# Patient Record
Sex: Female | Born: 1975
Health system: Southern US, Community
[De-identification: ages and names within clinical notes are randomized; demographics above are authoritative.]

## PROBLEM LIST (undated history)

## (undated) DIAGNOSIS — C44621 Squamous cell carcinoma of skin of unspecified upper limb, including shoulder: Secondary | ICD-10-CM

## (undated) DIAGNOSIS — E785 Hyperlipidemia, unspecified: Secondary | ICD-10-CM

## (undated) DIAGNOSIS — Z87442 Personal history of urinary calculi: Secondary | ICD-10-CM

## (undated) HISTORY — DX: Hyperlipidemia, unspecified: E78.5

## (undated) HISTORY — PX: KIDNEY STONE SURGERY: SHX686

## (undated) HISTORY — PX: SPHINCTEROTOMY: SHX5279

## (undated) HISTORY — DX: Squamous cell carcinoma of skin of unspecified upper limb, including shoulder: C44.621

## (undated) HISTORY — DX: Personal history of urinary calculi: Z87.442

---

## 1998-04-16 ENCOUNTER — Other Ambulatory Visit: Admission: RE | Admit: 1998-04-16 | Discharge: 1998-04-16 | Payer: Self-pay | Admitting: Obstetrics and Gynecology

## 1999-04-15 ENCOUNTER — Other Ambulatory Visit: Admission: RE | Admit: 1999-04-15 | Discharge: 1999-04-15 | Payer: Self-pay | Admitting: *Deleted

## 2000-06-09 ENCOUNTER — Other Ambulatory Visit: Admission: RE | Admit: 2000-06-09 | Discharge: 2000-06-09 | Payer: Self-pay | Admitting: *Deleted

## 2001-03-08 ENCOUNTER — Other Ambulatory Visit: Admission: RE | Admit: 2001-03-08 | Discharge: 2001-03-08 | Payer: Self-pay | Admitting: *Deleted

## 2001-09-29 ENCOUNTER — Inpatient Hospital Stay (HOSPITAL_COMMUNITY): Admission: AD | Admit: 2001-09-29 | Discharge: 2001-10-01 | Payer: Self-pay | Admitting: *Deleted

## 2001-11-03 ENCOUNTER — Other Ambulatory Visit: Admission: RE | Admit: 2001-11-03 | Discharge: 2001-11-03 | Payer: Self-pay | Admitting: *Deleted

## 2002-01-31 ENCOUNTER — Encounter: Admission: RE | Admit: 2002-01-31 | Discharge: 2002-01-31 | Payer: Self-pay | Admitting: Family Medicine

## 2002-01-31 ENCOUNTER — Encounter: Payer: Self-pay | Admitting: Family Medicine

## 2002-11-09 ENCOUNTER — Other Ambulatory Visit: Admission: RE | Admit: 2002-11-09 | Discharge: 2002-11-09 | Payer: Self-pay | Admitting: *Deleted

## 2005-12-30 ENCOUNTER — Encounter: Admission: RE | Admit: 2005-12-30 | Discharge: 2005-12-30 | Payer: Self-pay | Admitting: *Deleted

## 2008-01-12 IMAGING — MG MM DIGITAL DIAGNOSTIC BILAT
5 series · 5 of 5 positions shown · non-contrast
Comparison: none

DG DIAGNOSTIC BILATERAL
Bilateral CC and MLO view(s) were taken.

RIGHT BREAST ULTRASOUND
DIGITAL BILATERAL DIAGNOSTIC MAMMOGRAM WITH CAD AND RIGHT BREAST ULTRASOUND:
CLINICAL DATA: Right breast lump in the upper outer quadrant.

[R CC]
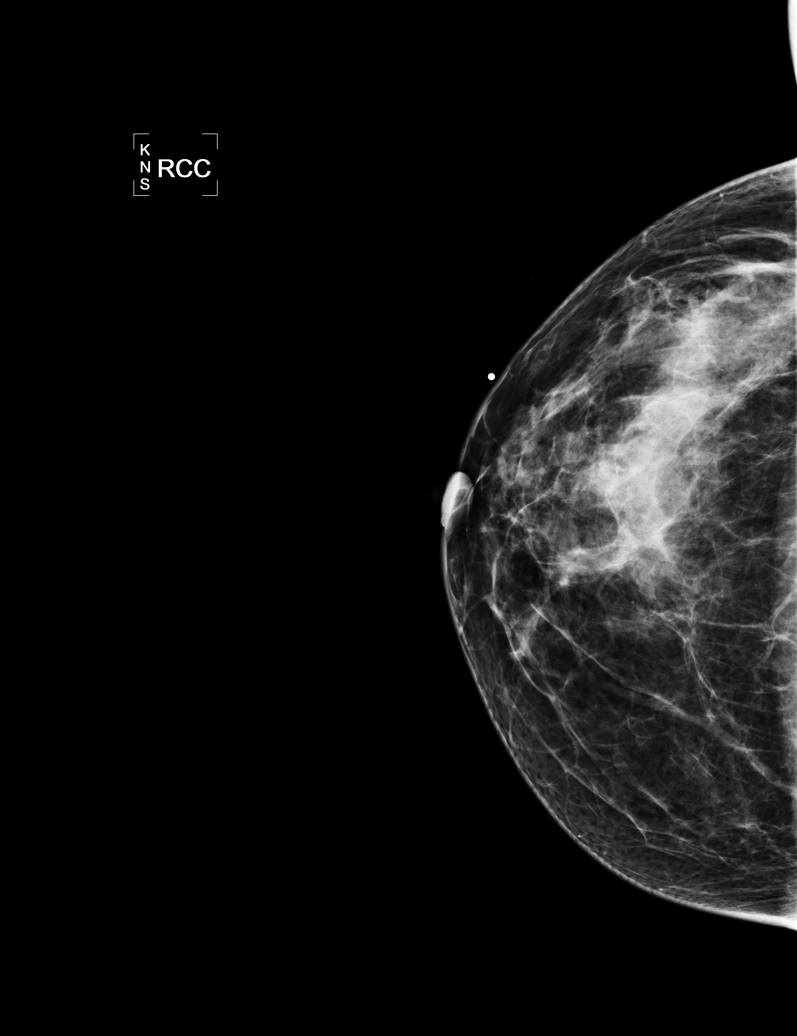

[L CC]
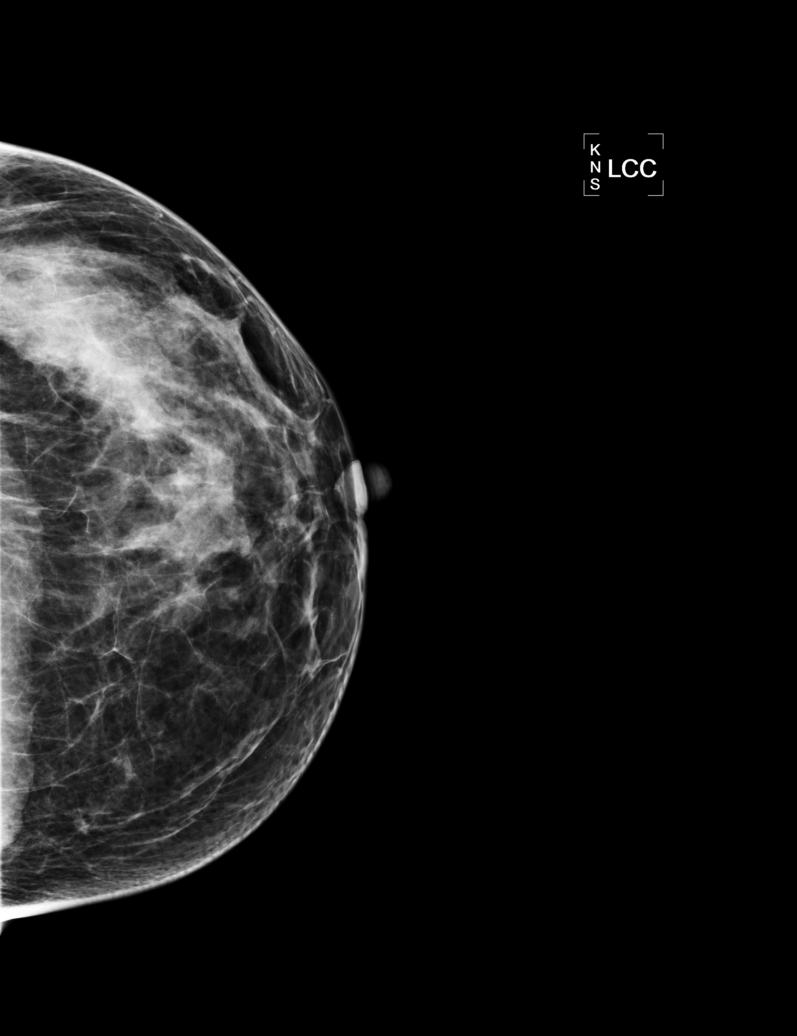

[L MLO]
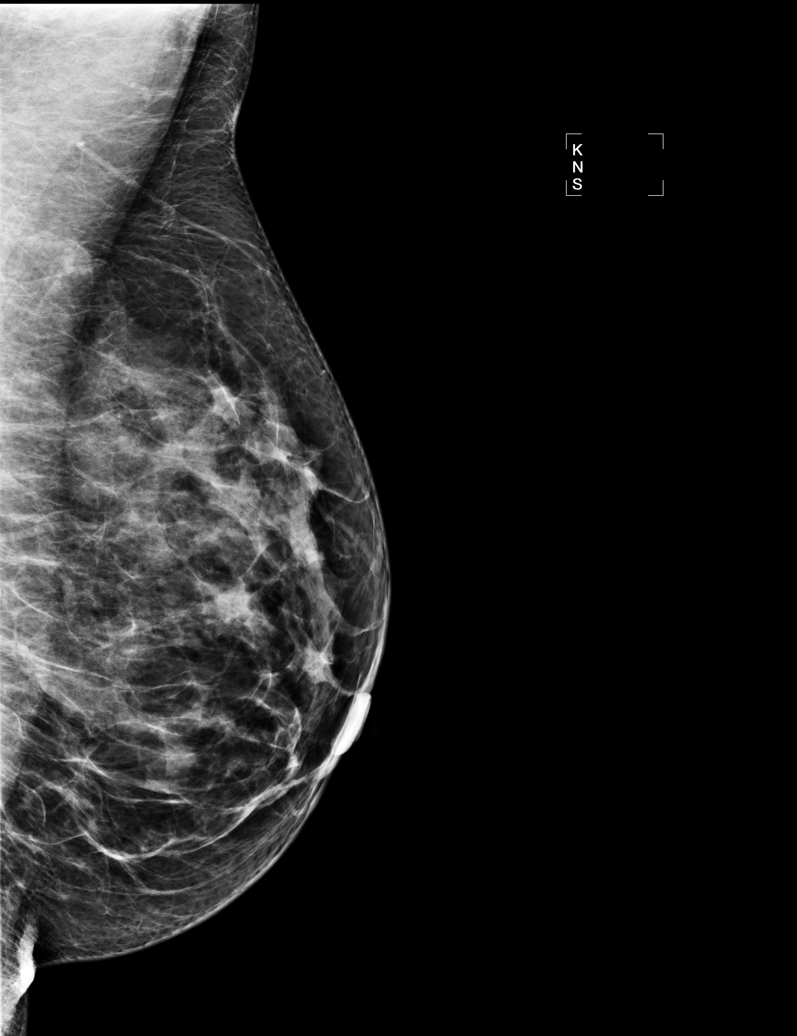

[R MLO]
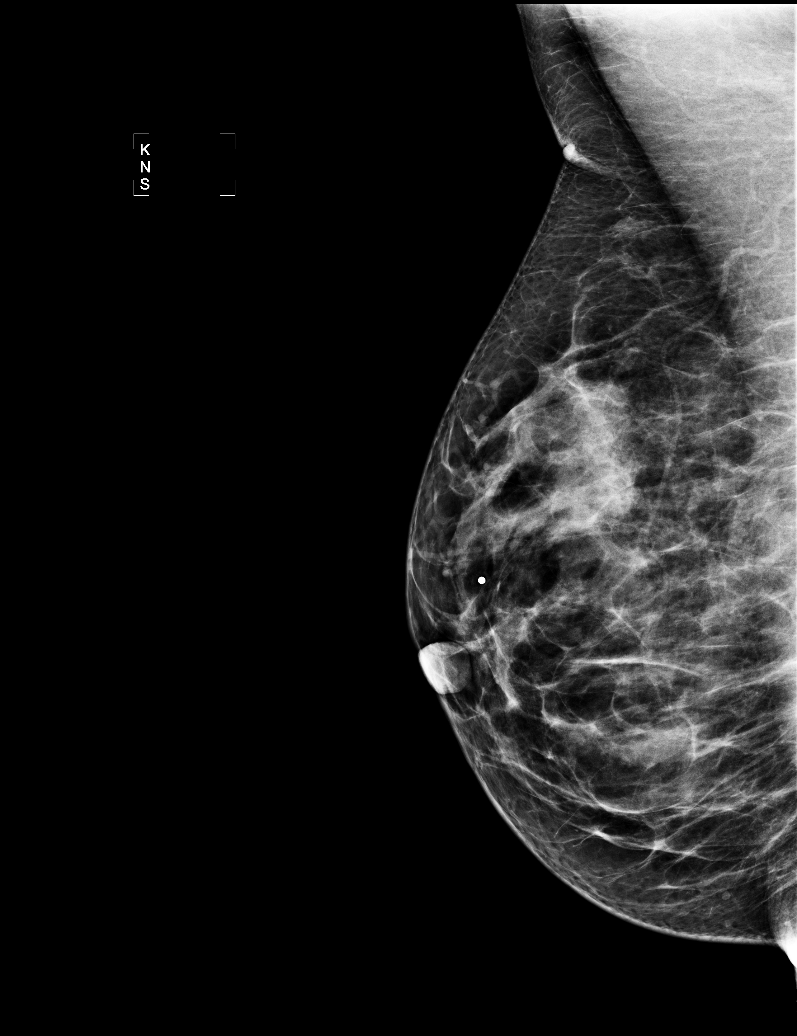

[R TAN]
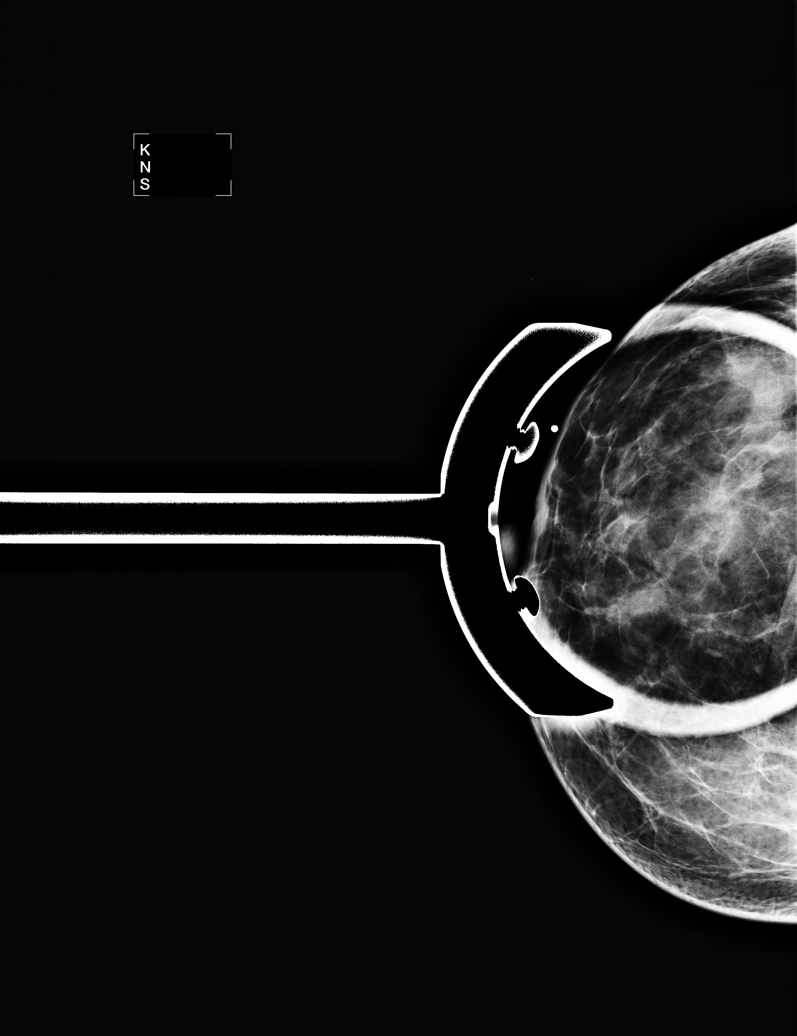

[5 of 5 positions shown; findings below may reference images not displayed]

This is the patient's baseline mammogram.  The fibroglandular parenchymal pattern is symmetric and 
stable.  No dominant masses, suspicious calcifications, or areas of architectural distortion are 
seen bilaterally.

On physical exam today, I palpate soft lumpy tissue throughout the upper outer quadrant in the 
patient's region of palpable concern and tenderness.  Ultrasound of this region reveals normal, but
dense fibroglandular tissue only.
IMPRESSION: There is no specific radiographic evidence of malignancy bilaterally.  Screening mammogram at age 
38 is recommended.

ASSESSMENT: Negative - BI-RADS 1

Routine screening mammogram at age 38.
ANALYZED BY COMPUTER AIDED DETECTION. ,

## 2009-02-25 LAB — HM MAMMOGRAPHY: HM MAMMO: NORMAL

## 2010-06-19 ENCOUNTER — Ambulatory Visit: Payer: Self-pay | Admitting: Surgery

## 2010-06-25 ENCOUNTER — Ambulatory Visit: Payer: Self-pay | Admitting: Surgery

## 2010-06-26 LAB — PATHOLOGY REPORT

## 2012-02-26 LAB — HM PAP SMEAR: HM Pap smear: NORMAL

## 2014-02-25 ENCOUNTER — Encounter: Payer: Self-pay | Admitting: Internal Medicine

## 2014-02-25 ENCOUNTER — Ambulatory Visit: Payer: Self-pay | Admitting: Internal Medicine

## 2014-02-25 ENCOUNTER — Encounter (INDEPENDENT_AMBULATORY_CARE_PROVIDER_SITE_OTHER): Payer: Self-pay

## 2014-02-25 ENCOUNTER — Ambulatory Visit (INDEPENDENT_AMBULATORY_CARE_PROVIDER_SITE_OTHER): Payer: BC Managed Care – PPO | Admitting: Internal Medicine

## 2014-02-25 VITALS — BP 100/66 | HR 61 | Temp 97.7°F | Ht 67.5 in | Wt 191.2 lb

## 2014-02-25 DIAGNOSIS — F5105 Insomnia due to other mental disorder: Secondary | ICD-10-CM | POA: Insufficient documentation

## 2014-02-25 DIAGNOSIS — F411 Generalized anxiety disorder: Secondary | ICD-10-CM

## 2014-02-25 DIAGNOSIS — E663 Overweight: Secondary | ICD-10-CM | POA: Insufficient documentation

## 2014-02-25 DIAGNOSIS — E669 Obesity, unspecified: Secondary | ICD-10-CM

## 2014-02-25 DIAGNOSIS — Z1239 Encounter for other screening for malignant neoplasm of breast: Secondary | ICD-10-CM | POA: Insufficient documentation

## 2014-02-25 DIAGNOSIS — Z800 Family history of malignant neoplasm of digestive organs: Secondary | ICD-10-CM | POA: Insufficient documentation

## 2014-02-25 DIAGNOSIS — Z23 Encounter for immunization: Secondary | ICD-10-CM

## 2014-02-25 DIAGNOSIS — F419 Anxiety disorder, unspecified: Secondary | ICD-10-CM | POA: Insufficient documentation

## 2014-02-25 MED ORDER — ALPRAZOLAM 0.25 MG PO TABS: 0.2500 mg | ORAL_TABLET | Freq: Two times a day (BID) | ORAL | Status: DC | PRN

## 2014-02-25 MED ORDER — PHENTERMINE HCL 37.5 MG PO CAPS: 37.5000 mg | ORAL_CAPSULE | ORAL | Status: DC

## 2014-02-25 NOTE — Assessment & Plan Note (Signed)
Wt Readings from Last 3 Encounters:  02/25/14 191 lb 4 oz (86.75 kg)   Body mass index is 29.49 kg/(m^2). Encouraged healthy diet and exercise. Will add phentermine to help with appetite suppression. Discussed potential side effects from this medication. Follow up in 1 month and prn.

## 2014-02-25 NOTE — Progress Notes (Signed)
Pre visit review using our clinic review tool, if applicable. No additional management support is needed unless otherwise documented below in the visit note. 

## 2014-02-25 NOTE — Assessment & Plan Note (Signed)
Mammogram ordered

## 2014-02-25 NOTE — Progress Notes (Signed)
Subjective:    Patient ID: Alexis Little, female    DOB: May 23, 1976, 38 y.o.   MRN: [id number]  HPI 38YO female presents to establish care.  Last year, had some depression after her father passed away last year. Started seeing Dr. Mervyn Skeeters, a psychologist, with some improvement. Continues to have occasional panic attacks, rare. Occasionally uses Alprazolam 0.25mg .  Concerned about weight. Has started walking 33min to 1 hr per day in effort to lose weight. Following healthy diet. Has lost 6lb on her own. Would like to use medication to help with appetite. Has never taken medication for this in the past.  Review of Systems  Constitutional: Negative for fever, chills, appetite change, fatigue and unexpected weight change.  Eyes: Negative for visual disturbance.  Respiratory: Negative for shortness of breath.   Cardiovascular: Negative for chest pain and leg swelling.  Gastrointestinal: Negative for nausea, vomiting, abdominal pain, diarrhea and constipation.  Musculoskeletal: Negative for arthralgias and myalgias.  Skin: Negative for color change and rash.  Hematological: Negative for adenopathy. Does not bruise/bleed easily.  Psychiatric/Behavioral: Negative for suicidal ideas, sleep disturbance and dysphoric mood. The patient is nervous/anxious.        Objective:    BP 100/66  Pulse 61  Temp(Src) 97.7 F (36.5 C) (Oral)  Ht 5' 7.5" (1.715 m)  Wt 191 lb 4 oz (86.75 kg)  BMI 29.49 kg/m2  SpO2 99%  LMP 01/28/2014 Physical Exam  Constitutional: She is oriented to person, place, and time. She appears well-developed and well-nourished. No distress.  HENT:  Head: Normocephalic and atraumatic.  Right Ear: External ear normal.  Left Ear: External ear normal.  Nose: Nose normal.  Mouth/Throat: Oropharynx is clear and moist. No oropharyngeal exudate.  Eyes: Conjunctivae are normal. Pupils are equal, round, and reactive to light. Right eye exhibits no discharge. Left eye exhibits no  discharge. No scleral icterus.  Neck: Normal range of motion. Neck supple. No tracheal deviation present. No thyromegaly present.  Cardiovascular: Normal rate, regular rhythm, normal heart sounds and intact distal pulses.  Exam reveals no gallop and no friction rub.   No murmur heard. Pulmonary/Chest: Effort normal and breath sounds normal. No accessory muscle usage. Not tachypneic. No respiratory distress. She has no decreased breath sounds. She has no wheezes. She has no rhonchi. She has no rales. She exhibits no tenderness.  Musculoskeletal: Normal range of motion. She exhibits no edema and no tenderness.  Lymphadenopathy:    She has no cervical adenopathy.  Neurological: She is alert and oriented to person, place, and time. No cranial nerve deficit. She exhibits normal muscle tone. Coordination normal.  Skin: Skin is warm and dry. No rash noted. She is not diaphoretic. No erythema. No pallor.  Psychiatric: She has a normal mood and affect. Her behavior is normal. Judgment and thought content normal.          Assessment & Plan:   Problem List Items Addressed This Visit     Unprioritized   Anxiety state     Continue to follow with Dr. Mervyn Skeeters. Continue prn Alprazolam.    Relevant Medications      ALPRAZolam  Duanne Moron) tablet   Family history of colon cancer     Strong family h/o colon cancer. Will set up evaluation with Dr. Tiffany Kocher for possible repeat colonoscopy. (last over 20 years ago)    Relevant Orders      Ambulatory referral to Gastroenterology   Overweight (BMI 25.0-29.9) - Primary  Wt Readings from Last 3 Encounters:  02/25/14 191 lb 4 oz (86.75 kg)   Body mass index is 29.49 kg/(m^2). Encouraged healthy diet and exercise. Will add phentermine to help with appetite suppression. Discussed potential side effects from this medication. Follow up in 1 month and prn.    Relevant Medications      phentermine capsule   Screening for breast cancer     Mammogram ordered.      Relevant Orders      MM Digital Screening       Return in about 4 weeks (around 03/25/2014) for Recheck.

## 2014-02-25 NOTE — Assessment & Plan Note (Signed)
Strong family h/o colon cancer. Will set up evaluation with Dr. Tiffany Kocher for possible repeat colonoscopy. (last over 20 years ago)

## 2014-02-25 NOTE — Patient Instructions (Signed)
Start Phentermine 37.5mg daily in the morning.  Follow up in 4 weeks. 

## 2014-02-25 NOTE — Assessment & Plan Note (Signed)
Continue to follow with Dr. Mervyn Skeeters. Continue prn Alprazolam.

## 2014-03-25 ENCOUNTER — Ambulatory Visit: Payer: Self-pay | Admitting: Internal Medicine

## 2014-03-25 ENCOUNTER — Ambulatory Visit (INDEPENDENT_AMBULATORY_CARE_PROVIDER_SITE_OTHER): Payer: BC Managed Care – PPO | Admitting: Internal Medicine

## 2014-03-25 ENCOUNTER — Encounter: Payer: Self-pay | Admitting: Internal Medicine

## 2014-03-25 VITALS — BP 110/76 | HR 71 | Temp 97.6°F | Ht 67.5 in | Wt 184.8 lb

## 2014-03-25 DIAGNOSIS — E663 Overweight: Secondary | ICD-10-CM

## 2014-03-25 DIAGNOSIS — L739 Follicular disorder, unspecified: Secondary | ICD-10-CM

## 2014-03-25 DIAGNOSIS — L731 Pseudofolliculitis barbae: Secondary | ICD-10-CM

## 2014-03-25 MED ORDER — GENTAMICIN SULFATE 0.1 % EX OINT: 1.0000 "application " | TOPICAL_OINTMENT | Freq: Three times a day (TID) | CUTANEOUS | Status: DC

## 2014-03-25 MED ORDER — PHENTERMINE HCL 37.5 MG PO CAPS: 37.5000 mg | ORAL_CAPSULE | ORAL | Status: DC

## 2014-03-25 NOTE — Assessment & Plan Note (Signed)
Wt Readings from Last 3 Encounters:  03/25/14 184 lb 12 oz (83.802 kg)  02/25/14 191 lb 4 oz (86.75 kg)   Body mass index is 28.49 kg/(m^2). Congratulated pt on weight loss. Will continue Phentermine. Encouraged healthy diet and exercise.

## 2014-03-25 NOTE — Patient Instructions (Signed)
Start Gentamicin twice daily to groin area. Call if lesions are not improving.  Follow up in 3 months or sooner as needed.

## 2014-03-25 NOTE — Assessment & Plan Note (Signed)
Start topical gentamicin. If no improvement, will plan to start oral doxycycline. Pt will email with update.

## 2014-03-25 NOTE — Progress Notes (Signed)
Subjective:    Patient ID: Alexis Little, female    DOB: 1975-12-29, 38 y.o.   MRN: [id number]  HPI 38YO female presents for follow up.  Overweight - Started on Phentermine to help with appetite suppression at visit 10/5. Tolerating well. Down 12lbs overall at home.  Walking daily and following healthy diet.  History of staph cellulitis over groin area in past.  Now notes some infected hair follicles in groin around time of menses. No fever, chills. Not taking anything for this. Has applied Peroxide.  Review of Systems  Constitutional: Negative for fever, chills, appetite change, fatigue and unexpected weight change.  Eyes: Negative for visual disturbance.  Respiratory: Negative for shortness of breath.   Cardiovascular: Negative for chest pain and leg swelling.  Gastrointestinal: Negative for abdominal pain.  Skin: Positive for color change, rash and wound.  Hematological: Negative for adenopathy. Does not bruise/bleed easily.  Psychiatric/Behavioral: Negative for dysphoric mood. The patient is not nervous/anxious.        Objective:    BP 110/76 mmHg  Pulse 71  Temp(Src) 97.6 F (36.4 C) (Oral)  Ht 5' 7.5" (1.715 m)  Wt 184 lb 12 oz (83.802 kg)  BMI 28.49 kg/m2  SpO2 95%  LMP 03/24/2014 Physical Exam  Constitutional: She is oriented to person, place, and time. She appears well-developed and well-nourished. No distress.  HENT:  Head: Normocephalic and atraumatic.  Right Ear: External ear normal.  Left Ear: External ear normal.  Nose: Nose normal.  Mouth/Throat: Oropharynx is clear and moist.  Eyes: Conjunctivae are normal. Pupils are equal, round, and reactive to light. Right eye exhibits no discharge. Left eye exhibits no discharge. No scleral icterus.  Neck: Normal range of motion. Neck supple. No tracheal deviation present. No thyromegaly present.  Cardiovascular: Normal rate, regular rhythm, normal heart sounds and intact distal pulses.  Exam reveals no gallop and  no friction rub.   No murmur heard. Pulmonary/Chest: Effort normal and breath sounds normal. No accessory muscle usage. No tachypnea. No respiratory distress. She has no decreased breath sounds. She has no wheezes. She has no rhonchi. She has no rales. She exhibits no tenderness.  Musculoskeletal: Normal range of motion. She exhibits no edema or tenderness.  Lymphadenopathy:    She has no cervical adenopathy.  Neurological: She is alert and oriented to person, place, and time. No cranial nerve deficit. She exhibits normal muscle tone. Coordination normal.  Skin: Skin is warm and dry. Rash (pustular over medial upper thighs bilaterally) noted. She is not diaphoretic. There is erythema. No pallor.  Psychiatric: She has a normal mood and affect. Her behavior is normal. Judgment and thought content normal.          Assessment & Plan:   Problem List Items Addressed This Visit      Unprioritized   Folliculitis of perineum    Start topical gentamicin. If no improvement, will plan to start oral doxycycline. Pt will email with update.    Relevant Medications      gentamicin (GARAMYCIN) ointment 0.1%   Overweight (BMI 25.0-29.9) - Primary    Wt Readings from Last 3 Encounters:  03/25/14 184 lb 12 oz (83.802 kg)  02/25/14 191 lb 4 oz (86.75 kg)   Body mass index is 28.49 kg/(m^2). Congratulated pt on weight loss. Will continue Phentermine. Encouraged healthy diet and exercise.    Relevant Medications      phentermine capsule       Return in about 3 months (around  06/25/2014).   

## 2014-03-27 ENCOUNTER — Telehealth: Payer: Self-pay | Admitting: Internal Medicine

## 2014-03-27 NOTE — Telephone Encounter (Signed)
Pt scheduled for additional views on 04/08/14.

## 2014-03-27 NOTE — Telephone Encounter (Signed)
Mammogram from 11/2 recommended additional views for asymmetry in the left breast. Has this been scheduled?

## 2014-04-08 ENCOUNTER — Ambulatory Visit: Payer: Self-pay | Admitting: Internal Medicine

## 2014-04-09 ENCOUNTER — Encounter: Payer: Self-pay | Admitting: *Deleted

## 2014-04-09 ENCOUNTER — Telehealth: Payer: Self-pay | Admitting: Internal Medicine

## 2014-04-09 DIAGNOSIS — R928 Other abnormal and inconclusive findings on diagnostic imaging of breast: Secondary | ICD-10-CM

## 2014-04-09 NOTE — Telephone Encounter (Signed)
Notified pt,  Pt is agreeable with seeing Dr Bary Castilla

## 2014-04-09 NOTE — Telephone Encounter (Signed)
[place] left breast and mammogram left breast show what appears to be a cluster of cysts in the breast. They have recommended follow up mammogram in 6 months. I would recommend having one of the surgeons, such as Dr. Bary Castilla, look at these images and decide if best course to wait or to do a needle aspiration now. We can set this up for her if she would like.

## 2014-04-09 NOTE — Telephone Encounter (Signed)
Bonnita Nasuti, Can you please schedule this? Thanks

## 2014-04-23 ENCOUNTER — Ambulatory Visit: Payer: Self-pay | Admitting: General Surgery

## 2014-05-09 ENCOUNTER — Ambulatory Visit: Payer: Self-pay | Admitting: General Surgery

## 2014-05-15 ENCOUNTER — Ambulatory Visit (INDEPENDENT_AMBULATORY_CARE_PROVIDER_SITE_OTHER): Payer: BC Managed Care – PPO | Admitting: General Surgery

## 2014-05-15 ENCOUNTER — Encounter: Payer: Self-pay | Admitting: General Surgery

## 2014-05-15 VITALS — BP 124/68 | HR 76 | Resp 12 | Ht 67.0 in | Wt 169.0 lb

## 2014-05-15 DIAGNOSIS — N63 Unspecified lump in unspecified breast: Secondary | ICD-10-CM

## 2014-05-15 NOTE — Progress Notes (Signed)
Patient ID: Alexis Little, female   DOB: 01-Nov-1975, 38 y.o.   MRN: [id number]  Chief Complaint  Patient presents with  . Other    mammogram    HPI Alexis Little is a 38 y.o. female who presents for a breast evaluation. The most recent mammogram was done on 04/08/14 and left breast ultrasound. Patient does perform regular self breast checks about every three months and gets regular mammograms done.    HPI   Past Medical History  Diagnosis Date  . Hyperlipidemia   . History of kidney stones   . Squamous cell carcinoma, arm     Followed by Dr. Evorn Gong    Past Surgical History  Procedure Laterality Date  . Kidney stone surgery    . Kidney stone surgery      surgical removal, Dr. Pearlie Oyster  . Vaginal delivery      2    Family History  Problem Relation Age of Onset  . Cancer Father     colon cancer or esophageal, widely metastatic  . Heart disease Father     AIFB  . Cancer Paternal Grandfather     lung cancer   . Cancer Paternal Aunt     pancreatic and colon  . Breast cancer Paternal Aunt     Social History History  Substance Use Topics  . Smoking status: Never Smoker   . Smokeless tobacco: Not on file  . Alcohol Use: No    Allergies  Allergen Reactions  . Lexapro [Escitalopram Oxalate]     Racing thoughts  . Sertraline     Unable to focus, panic attacks    Current Outpatient Prescriptions  Medication Sig Dispense Refill  . ALPRAZolam (XANAX) 0.25 MG tablet Take 1 tablet (0.25 mg total) by mouth 2 (two) times daily as needed for anxiety. 30 tablet 1  . phentermine 37.5 MG capsule Take 1 capsule (37.5 mg total) by mouth every morning. 30 capsule 2  . gentamicin ointment (GARAMYCIN) 0.1 % Apply 1 application topically 3 (three) times daily. 30 g 0   No current facility-administered medications for this visit.    Review of Systems Review of Systems  Constitutional: Negative.   Respiratory: Negative.   Cardiovascular: Negative.     Blood pressure 124/68,  pulse 76, resp. rate 12, height 5\' 7"  (1.702 m), weight 169 lb (76.658 kg).  Physical Exam Physical Exam  Constitutional: She is oriented to person, place, and time. She appears well-developed and well-nourished.  Eyes: Conjunctivae are normal. No scleral icterus.  Neck: Neck supple.  Cardiovascular: Normal rate, regular rhythm and normal heart sounds.   Pulmonary/Chest: Effort normal and breath sounds normal. Right breast exhibits no inverted nipple, no mass, no nipple discharge, no skin change and no tenderness. Left breast exhibits no inverted nipple, no mass, no nipple discharge, no skin change and no tenderness.  Abdominal: Soft. Bowel sounds are normal. There is no tenderness.  Lymphadenopathy:    She has no cervical adenopathy.    She has no axillary adenopathy.  Neurological: She is alert and oriented to person, place, and time.  Skin: Skin is warm and dry.    Data Reviewed Screening mammograms dated 03/25/2014 suggested a density in the left breast. BI-RADS-0.  Focal spot compression views and ultrasound of the left breast dated 04/08/2014 suggested a complex cyst at the 1:00 position 4 cm from the nipple measuring 8 mm in greatest diameter corresponding to the mass on mammogram. Noted at the 2:00 position 6 cm  from the nipple was a suspected cluster of cysts measuring up to 14 mm in diameter. BI-RADS-3.  Assessment    Likely cystic disease of the left breast, new screening study as previous exam was greater than 5 years ago.  Marked decrease in breast density since passed mammogram    Plan    Patient to return with a left breast mammogram followed by office ultrasound (if needed) in six months      PCP:  Jeoffrey Massed, Janett Billow 05/15/2014, 3:02 PM

## 2014-05-15 NOTE — Patient Instructions (Signed)
Patient to return in six month left breast ultrasound.

## 2014-05-17 DIAGNOSIS — N63 Unspecified lump in unspecified breast: Secondary | ICD-10-CM | POA: Insufficient documentation

## 2014-06-27 ENCOUNTER — Ambulatory Visit (INDEPENDENT_AMBULATORY_CARE_PROVIDER_SITE_OTHER): Payer: BLUE CROSS/BLUE SHIELD | Admitting: Internal Medicine

## 2014-06-27 ENCOUNTER — Encounter: Payer: Self-pay | Admitting: Internal Medicine

## 2014-06-27 VITALS — BP 103/70 | HR 81 | Temp 98.2°F | Ht 67.0 in | Wt 175.8 lb

## 2014-06-27 DIAGNOSIS — E663 Overweight: Secondary | ICD-10-CM

## 2014-06-27 MED ORDER — PHENTERMINE HCL 37.5 MG PO CAPS: 37.5000 mg | ORAL_CAPSULE | ORAL | Status: DC

## 2014-06-27 NOTE — Progress Notes (Signed)
   Subjective:    Patient ID: Alexis Little, female    DOB: 09-01-1975, 39 y.o.   MRN: [id number]  HPI  39YO female presents for follow up.  Just started back on phentermine 2 days ago. Trying to limit calories. Exercising by walking, but recently limited.  Wt Readings from Last 3 Encounters:  06/27/14 175 lb 12 oz (79.72 kg)  05/15/14 169 lb (76.658 kg)  03/25/14 184 lb 12 oz (83.802 kg)     Past medical, surgical, family and social history per today's encounter.  Review of Systems  Constitutional: Negative for fever, chills, appetite change, fatigue and unexpected weight change.  Eyes: Negative for visual disturbance.  Respiratory: Negative for shortness of breath.   Cardiovascular: Negative for chest pain and leg swelling.  Gastrointestinal: Negative for abdominal pain.  Skin: Negative for color change and rash.  Hematological: Negative for adenopathy. Does not bruise/bleed easily.  Psychiatric/Behavioral: Negative for dysphoric mood. The patient is not nervous/anxious.        Objective:    BP 103/70 mmHg  Pulse 81  Temp(Src) 98.2 F (36.8 C) (Oral)  Ht 5\' 7"  (1.702 m)  Wt 175 lb 12 oz (79.72 kg)  BMI 27.52 kg/m2  SpO2 96% Physical Exam  Constitutional: She is oriented to person, place, and time. She appears well-developed and well-nourished. No distress.  HENT:  Head: Normocephalic and atraumatic.  Right Ear: External ear normal.  Left Ear: External ear normal.  Nose: Nose normal.  Mouth/Throat: Oropharynx is clear and moist. No oropharyngeal exudate.  Eyes: Conjunctivae are normal. Pupils are equal, round, and reactive to light. Right eye exhibits no discharge. Left eye exhibits no discharge. No scleral icterus.  Neck: Normal range of motion. Neck supple. No tracheal deviation present. No thyromegaly present.  Cardiovascular: Normal rate, regular rhythm, normal heart sounds and intact distal pulses.  Exam reveals no gallop and no friction rub.   No murmur  heard. Pulmonary/Chest: Effort normal and breath sounds normal. No respiratory distress. She has no wheezes. She has no rales. She exhibits no tenderness.  Musculoskeletal: Normal range of motion. She exhibits no edema or tenderness.  Lymphadenopathy:    She has no cervical adenopathy.  Neurological: She is alert and oriented to person, place, and time. No cranial nerve deficit. She exhibits normal muscle tone. Coordination normal.  Skin: Skin is warm and dry. No rash noted. She is not diaphoretic. No erythema. No pallor.  Psychiatric: She has a normal mood and affect. Her behavior is normal. Judgment and thought content normal.          Assessment & Plan:   Problem List Items Addressed This Visit      Unprioritized   Overweight (BMI 25.0-29.9) - Primary    Wt Readings from Last 3 Encounters:  06/27/14 175 lb 12 oz (79.72 kg)  05/15/14 169 lb (76.658 kg)  03/25/14 184 lb 12 oz (83.802 kg)   Body mass index is 27.52 kg/(m^2). Encouraged healthy diet and exercise. Will restart phentermine to help with appetite suppression. Follow up in 3 months and prn.      Relevant Medications   phentermine capsule       Return in about 3 months (around 09/25/2014) for Recheck.

## 2014-06-27 NOTE — Progress Notes (Signed)
Pre visit review using our clinic review tool, if applicable. No additional management support is needed unless otherwise documented below in the visit note. 

## 2014-06-27 NOTE — Assessment & Plan Note (Signed)
Wt Readings from Last 3 Encounters:  06/27/14 175 lb 12 oz (79.72 kg)  05/15/14 169 lb (76.658 kg)  03/25/14 184 lb 12 oz (83.802 kg)   Body mass index is 27.52 kg/(m^2). Encouraged healthy diet and exercise. Will restart phentermine to help with appetite suppression. Follow up in 3 months and prn.

## 2014-06-27 NOTE — Patient Instructions (Signed)
Continue phentermine.  Follow up in 3 months.

## 2014-09-05 ENCOUNTER — Other Ambulatory Visit: Payer: Self-pay

## 2014-09-05 DIAGNOSIS — N63 Unspecified lump in unspecified breast: Secondary | ICD-10-CM

## 2014-10-09 ENCOUNTER — Telehealth: Payer: Self-pay | Admitting: *Deleted

## 2014-10-09 MED ORDER — FLUCONAZOLE 150 MG PO TABS: 150.0000 mg | ORAL_TABLET | Freq: Every day | ORAL | Status: DC

## 2014-10-09 NOTE — Telephone Encounter (Signed)
Fine to call in Diflucan 150mg  x1

## 2014-10-09 NOTE — Telephone Encounter (Signed)
Medication sent to pharmacy  

## 2014-10-09 NOTE — Telephone Encounter (Signed)
Pt called requesting Diflucan for yeast infection, Okay to fill?

## 2014-10-14 ENCOUNTER — Ambulatory Visit: Payer: Self-pay | Admitting: General Surgery

## 2014-10-16 ENCOUNTER — Ambulatory Visit (INDEPENDENT_AMBULATORY_CARE_PROVIDER_SITE_OTHER): Payer: BLUE CROSS/BLUE SHIELD | Admitting: General Surgery

## 2014-10-16 ENCOUNTER — Encounter: Payer: Self-pay | Admitting: General Surgery

## 2014-10-16 DIAGNOSIS — N6009 Solitary cyst of unspecified breast: Secondary | ICD-10-CM | POA: Insufficient documentation

## 2014-10-16 DIAGNOSIS — N6002 Solitary cyst of left breast: Secondary | ICD-10-CM

## 2014-10-16 NOTE — Patient Instructions (Signed)
Continue self breast exams. Call office for any new breast issues or concerns. 

## 2014-10-16 NOTE — Progress Notes (Signed)
Patient ID: Alexis Little, female   DOB: 10/17/1975, 39 y.o.   MRN: [id number]  Chief Complaint  Patient presents with  . Follow-up    mammogram    HPI Alexis Little is a 39 y.o. female who presents for a breast evaluation. The most recent mammogram and ultrasound was done on 10/08/14.  Patient does perform regular self breast checks and gets regular mammograms done.  No new breast issues. Denies any breast pain. Mild tenderness prior to menstrual periods. The patient reports that her most recent mammogram completed at [facility] was more uncomfortable than her prior studies at [facility]. This may been related to the timing of her menses.   HPI  Past Medical History  Diagnosis Date  . Hyperlipidemia   . History of kidney stones   . Squamous cell carcinoma, arm     Followed by Dr. Evorn Gong    Past Surgical History  Procedure Laterality Date  . Kidney stone surgery    . Kidney stone surgery      surgical removal, Dr. Pearlie Oyster age 19  . Vaginal delivery      2    Family History  Problem Relation Age of Onset  . Cancer Father     colon cancer or esophageal, widely metastatic  . Heart disease Father     AIFB  . Cancer Paternal Grandfather     lung cancer   . Cancer Paternal Aunt     pancreatic and colon  . Breast cancer Paternal Aunt     Social History History  Substance Use Topics  . Smoking status: Never Smoker   . Smokeless tobacco: Never Used  . Alcohol Use: No    Allergies  Allergen Reactions  . Lexapro [Escitalopram Oxalate]     Racing thoughts  . Sertraline     Unable to focus, panic attacks    Current Outpatient Prescriptions  Medication Sig Dispense Refill  . ALPRAZolam (XANAX) 0.25 MG tablet Take 1 tablet (0.25 mg total) by mouth 2 (two) times daily as needed for anxiety. 30 tablet 1  . fluconazole (DIFLUCAN) 150 MG tablet Take 1 tablet (150 mg total) by mouth daily. 1 tablet 0  . gentamicin ointment (GARAMYCIN) 0.1 % Apply 1 application topically 3  (three) times daily. 30 g 0  . phentermine 37.5 MG capsule Take 1 capsule (37.5 mg total) by mouth every morning. 30 capsule 2   No current facility-administered medications for this visit.    Review of Systems Review of Systems  Constitutional: Negative.   Respiratory: Negative.   Cardiovascular: Negative.     Blood pressure 110/68, pulse 80, resp. rate 14, height 5\' 7"  (1.702 m), weight 181 lb (82.101 kg), last menstrual period 09/16/2014.  Physical Exam Physical Exam  Constitutional: She is oriented to person, place, and time. She appears well-developed and well-nourished.  Neck: Neck supple.  Cardiovascular: Normal rate, regular rhythm and normal heart sounds.   Pulmonary/Chest: Effort normal and breath sounds normal. Right breast exhibits no inverted nipple, no mass, no nipple discharge, no skin change and no tenderness. Left breast exhibits no inverted nipple, no mass, no nipple discharge, no skin change and no tenderness.  Lymphadenopathy:    She has no cervical adenopathy.    She has no axillary adenopathy.  Neurological: She is alert and oriented to person, place, and time.  Skin: Skin is warm and dry.    Data Reviewed Left breast mammogram and ultrasound dated 10/08/2014 was reviewed and compared to November 2015  studies. No interval change. Stable small cyst in the upper-outer quadrant of the left breast.   Assessment    Asymptomatic left breast cyst.    Plan    No indication for intervention at this time. Should she develop pain, tenderness or anxiety related to their presence reassessment would be appropriate.  She should resume annual screening mammograms with her primary care provider in November 2016.     Follow up  Continue self breast exams. Call office for any new breast issues or concerns.  PCP:  Sonnie Alamo 10/16/2014, 5:03 PM

## 2015-01-13 ENCOUNTER — Ambulatory Visit (INDEPENDENT_AMBULATORY_CARE_PROVIDER_SITE_OTHER): Payer: BLUE CROSS/BLUE SHIELD | Admitting: Internal Medicine

## 2015-01-13 ENCOUNTER — Encounter: Payer: Self-pay | Admitting: Internal Medicine

## 2015-01-13 ENCOUNTER — Encounter (INDEPENDENT_AMBULATORY_CARE_PROVIDER_SITE_OTHER): Payer: Self-pay

## 2015-01-13 VITALS — BP 104/70 | HR 89 | Temp 98.7°F | Ht 67.0 in | Wt 180.0 lb

## 2015-01-13 DIAGNOSIS — R399 Unspecified symptoms and signs involving the genitourinary system: Secondary | ICD-10-CM

## 2015-01-13 DIAGNOSIS — R509 Fever, unspecified: Secondary | ICD-10-CM | POA: Insufficient documentation

## 2015-01-13 LAB — POCT URINALYSIS DIPSTICK
Bilirubin, UA: NEGATIVE
GLUCOSE UA: NEGATIVE
Ketones, UA: 40
NITRITE UA: NEGATIVE
PH UA: 7
Protein, UA: NEGATIVE
RBC UA: NEGATIVE
SPEC GRAV UA: 1.02
UROBILINOGEN UA: 1

## 2015-01-13 LAB — COMPREHENSIVE METABOLIC PANEL
ALT: 10 U/L (ref 0–35)
AST: 13 U/L (ref 0–37)
Albumin: 3.9 g/dL (ref 3.5–5.2)
Alkaline Phosphatase: 55 U/L (ref 39–117)
BUN: 9 mg/dL (ref 6–23)
CHLORIDE: 104 meq/L (ref 96–112)
CO2: 29 mEq/L (ref 19–32)
Calcium: 8.8 mg/dL (ref 8.4–10.5)
Creatinine, Ser: 0.7 mg/dL (ref 0.40–1.20)
GFR: 98.15 mL/min (ref >60.00)
Glucose, Bld: 81 mg/dL (ref 70–99)
Potassium: 4.2 mEq/L (ref 3.5–5.1)
Sodium: 139 mEq/L (ref 135–145)
Total Bilirubin: 0.5 mg/dL (ref 0.2–1.2)
Total Protein: 6.2 g/dL (ref 6.0–8.3)

## 2015-01-13 LAB — CBC WITH DIFFERENTIAL/PLATELET
BASOS ABS: 0 10*3/uL (ref 0.0–0.1)
Basophils Relative: 0.5 % (ref 0.0–3.0)
EOS ABS: 0.1 10*3/uL (ref 0.0–0.7)
Eosinophils Relative: 2.2 % (ref 0.0–5.0)
HCT: 41.3 % (ref 36.0–46.0)
HEMOGLOBIN: 14 g/dL (ref 12.0–15.0)
LYMPHS PCT: 11.9 % — AB (ref 12.0–46.0)
Lymphs Abs: 0.6 10*3/uL — ABNORMAL LOW (ref 0.7–4.0)
MCHC: 34 g/dL (ref 30.0–36.0)
MCV: 88.2 fl (ref 78.0–100.0)
Monocytes Absolute: 0.5 10*3/uL (ref 0.1–1.0)
Monocytes Relative: 8.6 % (ref 3.0–12.0)
Neutro Abs: 4.1 10*3/uL (ref 1.4–7.7)
Neutrophils Relative %: 76.8 % (ref 43.0–77.0)
PLATELETS: 230 10*3/uL (ref 150.0–400.0)
RBC: 4.68 Mil/uL (ref 3.87–5.11)
RDW: 12.3 % (ref 11.5–15.5)
WBC: 5.3 10*3/uL (ref 4.0–10.5)

## 2015-01-13 LAB — POCT INFLUENZA A/B
Influenza A, POC: NEGATIVE
Influenza B, POC: NEGATIVE

## 2015-01-13 MED ORDER — DOXYCYCLINE HYCLATE 100 MG PO TABS: 100.0000 mg | ORAL_TABLET | Freq: Two times a day (BID) | ORAL | Status: DC

## 2015-01-13 NOTE — Assessment & Plan Note (Signed)
Recent fever, chills, headache, myalgia. Symptoms most consistent with viral infection. Rapid flu neg. Will check CBC and CMP. Given headache will cover for RMSF with Doxycycline. Encouraged fluid intake, rest. Tylenol or Ibuprofen prn fever or pain. Follow up immediately if symptoms worsening.

## 2015-01-13 NOTE — Progress Notes (Signed)
Pre visit review using our clinic review tool, if applicable. No additional management support is needed unless otherwise documented below in the visit note. 

## 2015-01-13 NOTE — Progress Notes (Signed)
Subjective:    Patient ID: Alexis Little, female    DOB: 1975/05/29, 39 y.o.   MRN: [id number]  HPI  39YO female presents for acute visit.  Fever - Initially feeling tired. Fever starting Saturday night, with shaking chills. Then fever 101.8 yesterday. Notes diffuse headache, neck pain. Occasional cough non-productive. No NV. Pos myalgia. No recent tick bites. No rash. Taking Ibuprofen with some improvement.  Past Medical History  Diagnosis Date  . Hyperlipidemia   . History of kidney stones   . Squamous cell carcinoma, arm     Followed by Dr. Evorn Gong   Family History  Problem Relation Age of Onset  . Cancer Father     colon cancer or esophageal, widely metastatic  . Heart disease Father     AIFB  . Cancer Paternal Grandfather     lung cancer   . Cancer Paternal Aunt     pancreatic and colon  . Breast cancer Paternal Aunt    Past Surgical History  Procedure Laterality Date  . Kidney stone surgery    . Kidney stone surgery      surgical removal, Dr. Pearlie Oyster age 2  . Vaginal delivery      2   Social History   Social History  . Marital Status: Divorced    Spouse Name: N/A  . Number of Children: N/A  . Years of Education: N/A   Social History Main Topics  . Smoking status: Never Smoker   . Smokeless tobacco: Never Used  . Alcohol Use: No  . Drug Use: No  . Sexual Activity: Not Asked   Other Topics Concern  . None   Social History Narrative   Lives in [place] with fiance and three children. 1 dog in home.      Work - Pharmacologist, Tax adviser.      Diet - regular diet      Exercise - started walking daily    Review of Systems  Constitutional: Positive for fever, chills and fatigue. Negative for unexpected weight change.  HENT: Negative for congestion, ear discharge, ear pain, facial swelling, hearing loss, mouth sores, nosebleeds, postnasal drip, rhinorrhea, sinus pressure, sneezing, sore throat, tinnitus, trouble swallowing and voice  change.   Eyes: Negative for pain, discharge, redness and visual disturbance.  Respiratory: Positive for cough. Negative for chest tightness, shortness of breath, wheezing and stridor.   Cardiovascular: Negative for chest pain, palpitations and leg swelling.  Gastrointestinal: Negative for nausea, vomiting, diarrhea and constipation.  Musculoskeletal: Positive for myalgias and neck pain. Negative for arthralgias and neck stiffness.  Skin: Negative for color change and rash.  Neurological: Positive for headaches. Negative for dizziness, weakness and light-headedness.  Hematological: Negative for adenopathy.       Objective:    BP 104/70 mmHg  Pulse 89  Temp(Src) 98.7 F (37.1 C) (Oral)  Ht 5\' 7"  (1.702 m)  Wt 180 lb (81.647 kg)  BMI 28.19 kg/m2  SpO2 97% Physical Exam  Constitutional: She is oriented to person, place, and time. She appears well-developed and well-nourished. No distress.  HENT:  Head: Normocephalic and atraumatic.  Right Ear: External ear normal.  Left Ear: External ear normal.  Nose: Nose normal.  Mouth/Throat: Oropharynx is clear and moist. No oropharyngeal exudate.  Eyes: Conjunctivae are normal. Pupils are equal, round, and reactive to light. Right eye exhibits no discharge. Left eye exhibits no discharge. No scleral icterus.  Neck: Normal range of motion. Neck supple. No tracheal deviation present. No  thyromegaly present.  Cardiovascular: Normal rate, regular rhythm, normal heart sounds and intact distal pulses.  Exam reveals no gallop and no friction rub.   No murmur heard. Pulmonary/Chest: Effort normal and breath sounds normal. No respiratory distress. She has no wheezes. She has no rales. She exhibits no tenderness.  Musculoskeletal: Normal range of motion. She exhibits no edema or tenderness.  Lymphadenopathy:    She has no cervical adenopathy.  Neurological: She is alert and oriented to person, place, and time. No cranial nerve deficit. She exhibits  normal muscle tone. Coordination normal.  Skin: Skin is warm and dry. No rash noted. She is not diaphoretic. No erythema. No pallor.  Psychiatric: She has a normal mood and affect. Her behavior is normal. Judgment and thought content normal.          Assessment & Plan:   Problem List Items Addressed This Visit      Unprioritized   Fever and chills - Primary    Recent fever, chills, headache, myalgia. Symptoms most consistent with viral infection. Rapid flu neg. Will check CBC and CMP. Given headache will cover for RMSF with Doxycycline. Encouraged fluid intake, rest. Tylenol or Ibuprofen prn fever or pain. Follow up immediately if symptoms worsening.      Relevant Medications   doxycycline (VIBRA-TABS) 100 MG tablet   Other Relevant Orders   CBC with Differential/Platelet   Comprehensive metabolic panel   Rocky mtn spotted fvr abs pnl(IgG+IgM)   POCT Influenza A/B (Completed)    Other Visit Diagnoses    UTI symptoms        Relevant Orders    POCT urinalysis dipstick (Completed)        Return in about 2 years (around 01/12/2017).

## 2015-01-13 NOTE — Addendum Note (Signed)
Addended by: Karlene Einstein D on: 01/13/2015 02:18 PM   Modules accepted: Orders

## 2015-01-13 NOTE — Patient Instructions (Signed)
Labs today.  Start Doxycycline 100mg  twice daily.  Recheck in 2-3 days.

## 2015-01-15 ENCOUNTER — Other Ambulatory Visit (INDEPENDENT_AMBULATORY_CARE_PROVIDER_SITE_OTHER): Payer: BLUE CROSS/BLUE SHIELD

## 2015-01-15 DIAGNOSIS — R399 Unspecified symptoms and signs involving the genitourinary system: Secondary | ICD-10-CM

## 2015-01-15 LAB — URINE CULTURE: Colony Count: >=100000

## 2015-01-15 LAB — POCT URINALYSIS DIPSTICK
Bilirubin, UA: NEGATIVE
Glucose, UA: NEGATIVE
Ketones, UA: NEGATIVE
Leukocytes, UA: NEGATIVE
NITRITE UA: NEGATIVE
PH UA: 6.5
PROTEIN UA: NEGATIVE
RBC UA: NEGATIVE
Spec Grav, UA: 1.02
Urobilinogen, UA: 0.2

## 2015-01-15 NOTE — Addendum Note (Signed)
Addended by: Karlene Einstein D on: 01/15/2015 10:46 AM   Modules accepted: Orders

## 2015-01-16 ENCOUNTER — Ambulatory Visit: Payer: Self-pay | Admitting: Internal Medicine

## 2015-01-16 LAB — ROCKY MTN SPOTTED FVR ABS PNL(IGG+IGM)
RMSF IgG: 0.18 IV
RMSF IgM: 0.2 IV

## 2015-01-17 ENCOUNTER — Other Ambulatory Visit (INDEPENDENT_AMBULATORY_CARE_PROVIDER_SITE_OTHER): Payer: BLUE CROSS/BLUE SHIELD

## 2015-01-17 ENCOUNTER — Other Ambulatory Visit: Payer: Self-pay | Admitting: Internal Medicine

## 2015-01-17 DIAGNOSIS — R61 Generalized hyperhidrosis: Secondary | ICD-10-CM

## 2015-01-17 LAB — COMPREHENSIVE METABOLIC PANEL
ALBUMIN: 3.8 g/dL (ref 3.5–5.2)
ALT: 8 U/L (ref 0–35)
AST: 11 U/L (ref 0–37)
Alkaline Phosphatase: 55 U/L (ref 39–117)
BUN: 10 mg/dL (ref 6–23)
CALCIUM: 8.9 mg/dL (ref 8.4–10.5)
CHLORIDE: 103 meq/L (ref 96–112)
CO2: 32 meq/L (ref 19–32)
CREATININE: 0.74 mg/dL (ref 0.40–1.20)
GFR: 92.05 mL/min (ref >60.00)
Glucose, Bld: 72 mg/dL (ref 70–99)
POTASSIUM: 4.1 meq/L (ref 3.5–5.1)
Sodium: 142 mEq/L (ref 135–145)
Total Bilirubin: 0.5 mg/dL (ref 0.2–1.2)
Total Protein: 6.1 g/dL (ref 6.0–8.3)

## 2015-01-17 LAB — TSH: TSH: 0.93 u[IU]/mL (ref 0.35–4.50)

## 2015-01-17 LAB — CBC WITH DIFFERENTIAL/PLATELET
BASOS PCT: 0.6 % (ref 0.0–3.0)
Basophils Absolute: 0 10*3/uL (ref 0.0–0.1)
EOS PCT: 4.9 % (ref 0.0–5.0)
Eosinophils Absolute: 0.2 10*3/uL (ref 0.0–0.7)
HEMATOCRIT: 41.3 % (ref 36.0–46.0)
HEMOGLOBIN: 13.9 g/dL (ref 12.0–15.0)
LYMPHS PCT: 29.8 % (ref 12.0–46.0)
Lymphs Abs: 1.3 10*3/uL (ref 0.7–4.0)
MCHC: 33.7 g/dL (ref 30.0–36.0)
MCV: 88.9 fl (ref 78.0–100.0)
MONOS PCT: 7.2 % (ref 3.0–12.0)
Monocytes Absolute: 0.3 10*3/uL (ref 0.1–1.0)
NEUTROS ABS: 2.5 10*3/uL (ref 1.4–7.7)
Neutrophils Relative %: 57.5 % (ref 43.0–77.0)
PLATELETS: 296 10*3/uL (ref 150.0–400.0)
RBC: 4.65 Mil/uL (ref 3.87–5.11)
RDW: 12.5 % (ref 11.5–15.5)
WBC: 4.4 10*3/uL (ref 4.0–10.5)

## 2015-01-17 LAB — LACTATE DEHYDROGENASE: LDH: 145 U/L (ref 94–250)

## 2015-01-17 LAB — URINE CULTURE
Colony Count: NO GROWTH
ORGANISM ID, BACTERIA: NO GROWTH

## 2015-01-17 LAB — SEDIMENTATION RATE: SED RATE: 19 mm/h (ref 0–22)

## 2015-01-17 NOTE — Addendum Note (Signed)
Addended by: Johnsie Cancel on: 01/17/2015 01:13 PM   Modules accepted: Orders

## 2015-01-20 ENCOUNTER — Ambulatory Visit: Payer: Self-pay | Admitting: Internal Medicine

## 2015-01-22 ENCOUNTER — Ambulatory Visit (INDEPENDENT_AMBULATORY_CARE_PROVIDER_SITE_OTHER): Payer: BLUE CROSS/BLUE SHIELD | Admitting: Internal Medicine

## 2015-01-22 ENCOUNTER — Encounter: Payer: Self-pay | Admitting: Internal Medicine

## 2015-01-22 VITALS — BP 104/68 | HR 73 | Temp 98.1°F | Ht 67.0 in | Wt 183.0 lb

## 2015-01-22 DIAGNOSIS — F411 Generalized anxiety disorder: Secondary | ICD-10-CM

## 2015-01-22 DIAGNOSIS — R509 Fever, unspecified: Secondary | ICD-10-CM | POA: Diagnosis not present

## 2015-01-22 DIAGNOSIS — E663 Overweight: Secondary | ICD-10-CM

## 2015-01-22 MED ORDER — ALPRAZOLAM 0.25 MG PO TABS: 0.2500 mg | ORAL_TABLET | Freq: Two times a day (BID) | ORAL | Status: DC | PRN

## 2015-01-22 MED ORDER — PHENTERMINE HCL 37.5 MG PO CAPS: 37.5000 mg | ORAL_CAPSULE | ORAL | Status: DC

## 2015-01-22 NOTE — Assessment & Plan Note (Signed)
Symptoms well controlled on prn Alprazolam

## 2015-01-22 NOTE — Assessment & Plan Note (Signed)
Wt Readings from Last 3 Encounters:  01/22/15 183 lb (83.008 kg)  01/13/15 180 lb (81.647 kg)  10/16/14 181 lb (82.101 kg)   Body mass index is 28.66 kg/(m^2). Encouraged healthy diet and exercise. Will restart phentermine to help with appetite. Recheck weight and BP in 1 month.

## 2015-01-22 NOTE — Assessment & Plan Note (Signed)
Symptoms most likely secondary to viral infection. Labs normal and testing for RMSF negative. Will monitor for any recurrent symptoms.

## 2015-01-22 NOTE — Progress Notes (Signed)
Subjective:    Patient ID: Alexis Little, female    DOB: 1975/12/13, 39 y.o.   MRN: [id number]  HPI  39YO female presents for follow up. Recently seen for fever, chills, malaise. Started on Doxycycline to cover possible RMSF, however titer was negative.  Feeling well. No further fever, headache.  Would like to start back on phentermine to help with weight loss. Did not fill Rx in the past. Trying to follow a healthy diet and exercise.  Wt Readings from Last 3 Encounters:  01/22/15 183 lb (83.008 kg)  01/13/15 180 lb (81.647 kg)  10/16/14 181 lb (82.101 kg)   BP Readings from Last 3 Encounters:  01/22/15 104/68  01/13/15 104/70  10/16/14 110/68     Past Medical History  Diagnosis Date  . Hyperlipidemia   . History of kidney stones   . Squamous cell carcinoma, arm     Followed by Dr. Evorn Gong   Family History  Problem Relation Age of Onset  . Cancer Father     colon cancer or esophageal, widely metastatic  . Heart disease Father     AIFB  . Cancer Paternal Grandfather     lung cancer   . Cancer Paternal Aunt     pancreatic and colon  . Breast cancer Paternal Aunt    Past Surgical History  Procedure Laterality Date  . Kidney stone surgery    . Kidney stone surgery      surgical removal, Dr. Pearlie Oyster age 4  . Vaginal delivery      2   Social History   Social History  . Marital Status: Divorced    Spouse Name: N/A  . Number of Children: N/A  . Years of Education: N/A   Social History Main Topics  . Smoking status: Never Smoker   . Smokeless tobacco: Never Used  . Alcohol Use: No  . Drug Use: No  . Sexual Activity: Not Asked   Other Topics Concern  . None   Social History Narrative   Lives in [place] with fiance and three children. 1 dog in home.      Work - Pharmacologist, Tax adviser.      Diet - regular diet      Exercise - started walking daily    Review of Systems  Constitutional: Negative for fever, chills, diaphoresis,  appetite change, fatigue and unexpected weight change.  Eyes: Negative for visual disturbance.  Respiratory: Negative for cough and shortness of breath.   Cardiovascular: Negative for chest pain and leg swelling.  Gastrointestinal: Negative for vomiting, abdominal pain, diarrhea and constipation.  Musculoskeletal: Negative for myalgias and arthralgias.  Skin: Negative for color change and rash.  Hematological: Negative for adenopathy. Does not bruise/bleed easily.  Psychiatric/Behavioral: Negative for dysphoric mood. The patient is not nervous/anxious.        Objective:    BP 104/68 mmHg  Pulse 73  Temp(Src) 98.1 F (36.7 C) (Oral)  Ht 5\' 7"  (1.702 m)  Wt 183 lb (83.008 kg)  BMI 28.66 kg/m2  SpO2 97% Physical Exam  Constitutional: She is oriented to person, place, and time. She appears well-developed and well-nourished. No distress.  HENT:  Head: Normocephalic and atraumatic.  Right Ear: External ear normal.  Left Ear: External ear normal.  Nose: Nose normal.  Mouth/Throat: Oropharynx is clear and moist. No oropharyngeal exudate.  Eyes: Conjunctivae are normal. Pupils are equal, round, and reactive to light. Right eye exhibits no discharge. Left eye exhibits no discharge. No  scleral icterus.  Neck: Normal range of motion. Neck supple. No tracheal deviation present. No thyromegaly present.  Cardiovascular: Normal rate, regular rhythm, normal heart sounds and intact distal pulses.  Exam reveals no gallop and no friction rub.   No murmur heard. Pulmonary/Chest: Effort normal and breath sounds normal. No respiratory distress. She has no wheezes. She has no rales. She exhibits no tenderness.  Musculoskeletal: Normal range of motion. She exhibits no edema or tenderness.  Lymphadenopathy:    She has no cervical adenopathy.  Neurological: She is alert and oriented to person, place, and time. No cranial nerve deficit. She exhibits normal muscle tone. Coordination normal.  Skin: Skin is  warm and dry. No rash noted. She is not diaphoretic. No erythema. No pallor.  Psychiatric: She has a normal mood and affect. Her behavior is normal. Judgment and thought content normal.          Assessment & Plan:   Problem List Items Addressed This Visit      Unprioritized   Anxiety state    Symptoms well controlled on prn Alprazolam      Relevant Medications   ALPRAZolam (XANAX) 0.25 MG tablet   Fever and chills - Primary    Symptoms most likely secondary to viral infection. Labs normal and testing for RMSF negative. Will monitor for any recurrent symptoms.      Overweight (BMI 25.0-29.9)    Wt Readings from Last 3 Encounters:  01/22/15 183 lb (83.008 kg)  01/13/15 180 lb (81.647 kg)  10/16/14 181 lb (82.101 kg)   Body mass index is 28.66 kg/(m^2). Encouraged healthy diet and exercise. Will restart phentermine to help with appetite. Recheck weight and BP in 1 month.      Relevant Medications   phentermine 37.5 MG capsule       Return in about 4 weeks (around 02/19/2015) for Recheck.

## 2015-01-22 NOTE — Patient Instructions (Signed)
Follow up in 1 month and as needed.

## 2015-01-22 NOTE — Progress Notes (Signed)
Pre visit review using our clinic review tool, if applicable. No additional management support is needed unless otherwise documented below in the visit note. 

## 2015-05-13 ENCOUNTER — Encounter: Payer: Self-pay | Admitting: Internal Medicine

## 2015-05-13 ENCOUNTER — Ambulatory Visit (INDEPENDENT_AMBULATORY_CARE_PROVIDER_SITE_OTHER): Payer: BLUE CROSS/BLUE SHIELD | Admitting: Internal Medicine

## 2015-05-13 VITALS — BP 103/67 | HR 70 | Temp 97.6°F | Ht 68.0 in | Wt 185.0 lb

## 2015-05-13 DIAGNOSIS — J0100 Acute maxillary sinusitis, unspecified: Secondary | ICD-10-CM

## 2015-05-13 MED ORDER — FLUCONAZOLE 150 MG PO TABS: 150.0000 mg | ORAL_TABLET | Freq: Once | ORAL | Status: DC

## 2015-05-13 MED ORDER — HYDROCOD POLST-CPM POLST ER 10-8 MG/5ML PO SUER: 5.0000 mL | Freq: Two times a day (BID) | ORAL | Status: DC | PRN

## 2015-05-13 MED ORDER — AMOXICILLIN-POT CLAVULANATE 875-125 MG PO TABS: 1.0000 | ORAL_TABLET | Freq: Two times a day (BID) | ORAL | Status: DC

## 2015-05-13 NOTE — Progress Notes (Signed)
Pre visit review using our clinic review tool, if applicable. No additional management support is needed unless otherwise documented below in the visit note. 

## 2015-05-13 NOTE — Patient Instructions (Signed)
Start Augmentin twice daily to treat sinus infection.  Use Tussionex as needed for cough. This medication will make you drowsy.  Follow up if symptoms not improving over the next week.

## 2015-05-13 NOTE — Assessment & Plan Note (Signed)
Symptoms most consistent with maxillary sinusitis. Will start Augmentin bid. Tussionex as needed for cough. Take probiotic yogurt while on antibiotics. Rest, adequate fluids.  Discussed return precautions. Follow up if symptoms not improving.

## 2015-05-13 NOTE — Progress Notes (Signed)
Subjective:    Patient ID: Alexis Little, female    DOB: Nov 09, 1975, 39 y.o.   MRN: [id number]  HPI  39YO female presents for acute visit.  Cough - Nasal congestion and cough over the last 3 weeks. Purulent and bloody-brown nasal congestion over last week. Sinus pressure over cheeks and forehead. No dyspnea. No chest pain. Cough keeps her awake at night. Cough seems worse this week. No sick contacts. Took Mucinex with no improvement.    Wt Readings from Last 3 Encounters:  05/13/15 185 lb (83.915 kg)  01/22/15 183 lb (83.008 kg)  01/13/15 180 lb (81.647 kg)   BP Readings from Last 3 Encounters:  05/13/15 103/67  01/22/15 104/68  01/13/15 104/70    Past Medical History  Diagnosis Date  . Hyperlipidemia   . History of kidney stones   . Squamous cell carcinoma, arm     Followed by Dr. Evorn Gong   Family History  Problem Relation Age of Onset  . Cancer Father     colon cancer or esophageal, widely metastatic  . Heart disease Father     AIFB  . Cancer Paternal Grandfather     lung cancer   . Cancer Paternal Aunt     pancreatic and colon  . Breast cancer Paternal Aunt    Past Surgical History  Procedure Laterality Date  . Kidney stone surgery    . Kidney stone surgery      surgical removal, Dr. Pearlie Oyster age 51  . Vaginal delivery      2   Social History   Social History  . Marital Status: Divorced    Spouse Name: N/A  . Number of Children: N/A  . Years of Education: N/A   Social History Main Topics  . Smoking status: Never Smoker   . Smokeless tobacco: Never Used  . Alcohol Use: No  . Drug Use: No  . Sexual Activity: Not Asked   Other Topics Concern  . None   Social History Narrative   Lives in [place] with fiance and three children. 1 dog in home.      Work - Pharmacologist, Tax adviser.      Diet - regular diet      Exercise - started walking daily    Review of Systems  Constitutional: Positive for fatigue. Negative for fever,  chills and unexpected weight change.  HENT: Positive for congestion, ear pain, postnasal drip, rhinorrhea and sinus pressure. Negative for ear discharge, facial swelling, hearing loss, mouth sores, nosebleeds, sneezing, sore throat, tinnitus, trouble swallowing and voice change.   Eyes: Negative for pain, discharge, redness and visual disturbance.  Respiratory: Positive for cough. Negative for chest tightness, shortness of breath, wheezing and stridor.   Cardiovascular: Negative for chest pain, palpitations and leg swelling.  Musculoskeletal: Negative for myalgias, arthralgias, neck pain and neck stiffness.  Skin: Negative for color change and rash.  Neurological: Negative for dizziness, weakness, light-headedness and headaches.  Hematological: Negative for adenopathy.  Psychiatric/Behavioral: Positive for sleep disturbance.       Objective:    BP 103/67 mmHg  Pulse 70  Temp(Src) 97.6 F (36.4 C) (Oral)  Ht 5\' 8"  (1.727 m)  Wt 185 lb (83.915 kg)  BMI 28.14 kg/m2  SpO2 98%  LMP 05/11/2015 Physical Exam  Constitutional: She is oriented to person, place, and time. She appears well-developed and well-nourished. No distress.  HENT:  Head: Normocephalic and atraumatic.  Right Ear: External ear normal. A middle ear effusion is  present.  Left Ear: External ear normal. Tympanic membrane is erythematous.  Nose: Right sinus exhibits maxillary sinus tenderness. Left sinus exhibits maxillary sinus tenderness.  Mouth/Throat: Oropharynx is clear and moist. No oropharyngeal exudate.  Eyes: Conjunctivae are normal. Pupils are equal, round, and reactive to light. Right eye exhibits no discharge. Left eye exhibits no discharge. No scleral icterus.  Neck: Normal range of motion. Neck supple. No tracheal deviation present. No thyromegaly present.  Cardiovascular: Normal rate, regular rhythm, normal heart sounds and intact distal pulses.  Exam reveals no gallop and no friction rub.   No murmur  heard. Pulmonary/Chest: Effort normal. No accessory muscle usage. No tachypnea. No respiratory distress. She has no decreased breath sounds. She has no wheezes. She has rhonchi (diffuse scattered). She has no rales. She exhibits no tenderness.  Musculoskeletal: Normal range of motion. She exhibits no edema or tenderness.  Lymphadenopathy:    She has no cervical adenopathy.  Neurological: She is alert and oriented to person, place, and time. No cranial nerve deficit. She exhibits normal muscle tone. Coordination normal.  Skin: Skin is warm and dry. No rash noted. She is not diaphoretic. No erythema. No pallor.  Psychiatric: She has a normal mood and affect. Her behavior is normal. Judgment and thought content normal.          Assessment & Plan:   Problem List Items Addressed This Visit      Unprioritized   Acute maxillary sinusitis - Primary    Symptoms most consistent with maxillary sinusitis. Will start Augmentin bid. Tussionex as needed for cough. Take probiotic yogurt while on antibiotics. Rest, adequate fluids.  Discussed return precautions. Follow up if symptoms not improving.      Relevant Medications   amoxicillin-clavulanate (AUGMENTIN) 875-125 MG tablet   chlorpheniramine-HYDROcodone (TUSSIONEX PENNKINETIC ER) 10-8 MG/5ML SUER   fluconazole (DIFLUCAN) 150 MG tablet       Return if symptoms worsen or fail to improve.

## 2015-06-06 ENCOUNTER — Other Ambulatory Visit: Payer: Self-pay | Admitting: Internal Medicine

## 2015-06-06 NOTE — Telephone Encounter (Signed)
Okay to refill? Last seen on 05/13/15.

## 2015-08-28 DIAGNOSIS — Z1231 Encounter for screening mammogram for malignant neoplasm of breast: Secondary | ICD-10-CM | POA: Diagnosis not present

## 2015-08-29 LAB — HM MAMMOGRAPHY

## 2015-09-15 ENCOUNTER — Telehealth: Payer: Self-pay | Admitting: Internal Medicine

## 2015-09-15 NOTE — Telephone Encounter (Signed)
I spoke with patient & she explained that this happened on Saturday & hasn't noticed any discharge or bleeding since then. She doesn't feel that it was a blood clot, because of the consistency of it. She has had some back pain yesterday & today. But denies any urinary issues, bleeding, discharge, or odor. I recommended that she keeps her appt on Wednesday with Lorane Gell, NP & informed her that we will call her back if Dr. Gilford Rile felt that she should be seen sooner.

## 2015-09-15 NOTE — Telephone Encounter (Signed)
Pt called in Gummie type tissue on tissue/back was hurting on friday/saturday started bleeding put in tampon and removed tampon no blood. Pt alos has a pic of it. Pt wanted to see Dr Gilford Rile no appt avail. Pt is sch with [facility] on 09/17/2015. Call pt @ [phone]. Thank you!

## 2015-09-17 ENCOUNTER — Encounter: Payer: Self-pay | Admitting: Nurse Practitioner

## 2015-09-17 ENCOUNTER — Ambulatory Visit (INDEPENDENT_AMBULATORY_CARE_PROVIDER_SITE_OTHER): Payer: BLUE CROSS/BLUE SHIELD | Admitting: Nurse Practitioner

## 2015-09-17 VITALS — BP 106/64 | HR 72 | Temp 98.1°F | Ht 68.0 in | Wt 190.8 lb

## 2015-09-17 DIAGNOSIS — N920 Excessive and frequent menstruation with regular cycle: Secondary | ICD-10-CM | POA: Diagnosis not present

## 2015-09-17 DIAGNOSIS — N926 Irregular menstruation, unspecified: Secondary | ICD-10-CM | POA: Diagnosis not present

## 2015-09-17 NOTE — Patient Instructions (Addendum)
Checking labs- follow up with Dr. Gilford Rile for your PAP and physical exam.   Safe travels!

## 2015-09-17 NOTE — Progress Notes (Signed)
Patient ID: Alexis Little, female    DOB: [birth year]  Age: 40 y.o. MRN: [id number]  CC: Vaginal Bleeding   HPI Alexis Little presents for CC of menstruation concerns.   1) Pt will be in [place] for next 11 days she reports for her daughter's cheer competition.   Pt has a picture of the clot she passed-bigger than quarter size  Friday back pain that was slightly achy and unusual for pt  Sat- wiped blood then clot, put in tampon earlier than that and after taking it out the clot happened after urinating  Back pain on Monday and started having heavy bleeding  Light bleeding still today  Nothing like this has happened previously Light bleeding for 3 days is usual cycle  Husband has vasectomy and they are sexually active   Pt reports- 2 years of mood swings, night sweats, and her paternal grandmother started menopause at 14 Increased in migraines over the past 2 years around menstrual cycle   History Alexis Little has a past medical history of Hyperlipidemia; History of kidney stones; and Squamous cell carcinoma, arm.   She has past surgical history that includes Kidney stone surgery; Kidney stone surgery; and Vaginal delivery.   Her family history includes Breast cancer in her paternal aunt; Cancer in her father, paternal aunt, and paternal grandfather; Heart disease in her father.She reports that she has never smoked. She has never used smokeless tobacco. She reports that she does not drink alcohol or use illicit drugs.  Outpatient Prescriptions Prior to Visit  Medication Sig Dispense Refill  . ALPRAZolam (XANAX) 0.25 MG tablet TAKE ONE TABLET BY MOUTH TWICE DAILY AS NEEDED FOR ANXIETY. (Patient not taking: Reported on 09/17/2015) 30 tablet 3  . amoxicillin-clavulanate (AUGMENTIN) 875-125 MG tablet Take 1 tablet by mouth 2 (two) times daily. 20 tablet 0  . chlorpheniramine-HYDROcodone (TUSSIONEX PENNKINETIC ER) 10-8 MG/5ML SUER Take 5 mLs by mouth every 12 (twelve) hours as needed. 140 mL 0  .  fluconazole (DIFLUCAN) 150 MG tablet Take 1 tablet (150 mg total) by mouth once. 1 tablet 0  . gentamicin ointment (GARAMYCIN) 0.1 % Apply 1 application topically 3 (three) times daily. 30 g 0   No facility-administered medications prior to visit.    ROS Review of Systems  Constitutional: Negative for fever, chills, diaphoresis and fatigue.  Respiratory: Negative for chest tightness, shortness of breath and wheezing.   Cardiovascular: Negative for chest pain, palpitations and leg swelling.  Gastrointestinal: Negative for nausea, vomiting and diarrhea.  Genitourinary: Positive for vaginal bleeding and menstrual problem. Negative for urgency, decreased urine volume, vaginal discharge, difficulty urinating, vaginal pain, pelvic pain and dyspareunia.  Musculoskeletal: Positive for back pain.  Skin: Negative for rash.  Neurological: Negative for dizziness, weakness, numbness and headaches.  Psychiatric/Behavioral: The patient is not nervous/anxious.     Objective:  BP 106/64 mmHg  Pulse 72  Temp(Src) 98.1 F (36.7 C) (Oral)  Ht 5\' 8"  (1.727 m)  Wt 190 lb 12.8 oz (86.546 kg)  BMI 29.02 kg/m2  SpO2 98%  LMP 08/27/2015 (Within Days)  Physical Exam  Constitutional: She is oriented to person, place, and time. She appears well-developed and well-nourished. No distress.  HENT:  Head: Normocephalic and atraumatic.  Right Ear: External ear normal.  Left Ear: External ear normal.  Cardiovascular: Normal rate, regular rhythm and normal heart sounds.   Pulmonary/Chest: Effort normal and breath sounds normal. No respiratory distress. She has no wheezes. She has no rales. She exhibits no tenderness.  Genitourinary:  Declines today   Neurological: She is alert and oriented to person, place, and time. No cranial nerve deficit. She exhibits normal muscle tone. Coordination normal.  Skin: Skin is warm and dry. No rash noted. She is not diaphoretic.  Psychiatric: She has a normal mood and affect.  Her behavior is normal. Judgment and thought content normal.   Assessment & Plan:   Alexis Little was seen today for vaginal bleeding.  Diagnoses and all orders for this visit:  Menorrhagia with regular cycle -     CBC with Differential/Platelet -     INR/PT  Irregular menstrual cycle   I have discontinued Ms. Adah Salvage gentamicin ointment, amoxicillin-clavulanate, chlorpheniramine-HYDROcodone, fluconazole, and ALPRAZolam.  No orders of the defined types were placed in this encounter.     Follow-up: Return if symptoms worsen or fail to improve.

## 2015-09-18 LAB — CBC WITH DIFFERENTIAL/PLATELET
BASOS PCT: 0.1 % (ref 0.0–3.0)
Basophils Absolute: 0 10*3/uL (ref 0.0–0.1)
EOS PCT: 5.8 % — AB (ref 0.0–5.0)
Eosinophils Absolute: 0.3 10*3/uL (ref 0.0–0.7)
HCT: 39.9 % (ref 36.0–46.0)
Hemoglobin: 13.4 g/dL (ref 12.0–15.0)
LYMPHS ABS: 1.4 10*3/uL (ref 0.7–4.0)
Lymphocytes Relative: 27.1 % (ref 12.0–46.0)
MCHC: 33.7 g/dL (ref 30.0–36.0)
MCV: 88.3 fl (ref 78.0–100.0)
MONO ABS: 0.4 10*3/uL (ref 0.1–1.0)
MONOS PCT: 7.5 % (ref 3.0–12.0)
Neutro Abs: 3 10*3/uL (ref 1.4–7.7)
Neutrophils Relative %: 59.5 % (ref 43.0–77.0)
Platelets: 249 10*3/uL (ref 150.0–400.0)
RBC: 4.52 Mil/uL (ref 3.87–5.11)
RDW: 12.4 % (ref 11.5–15.5)
WBC: 5.1 10*3/uL (ref 4.0–10.5)

## 2015-09-18 LAB — PROTIME-INR
INR: 1.1 ratio — AB (ref 0.8–1.0)
Prothrombin Time: 11.1 s (ref 9.6–13.1)

## 2015-09-21 DIAGNOSIS — N926 Irregular menstruation, unspecified: Secondary | ICD-10-CM | POA: Insufficient documentation

## 2015-09-21 NOTE — Assessment & Plan Note (Addendum)
Discussed possibilities  Checking labs  Likely peri-menopausal  Asked her to watch and FU if worsening with PCP has PAP scheduled for this summer

## 2015-10-13 ENCOUNTER — Encounter: Payer: Self-pay | Admitting: Internal Medicine

## 2015-11-19 ENCOUNTER — Ambulatory Visit (INDEPENDENT_AMBULATORY_CARE_PROVIDER_SITE_OTHER): Payer: BLUE CROSS/BLUE SHIELD | Admitting: Internal Medicine

## 2015-11-19 ENCOUNTER — Other Ambulatory Visit (HOSPITAL_COMMUNITY)
Admission: RE | Admit: 2015-11-19 | Discharge: 2015-11-19 | Disposition: A | Discharge status: Home / Self Care | Payer: BLUE CROSS/BLUE SHIELD | Source: Ambulatory Visit | Attending: Internal Medicine | Admitting: Internal Medicine

## 2015-11-19 ENCOUNTER — Encounter: Payer: Self-pay | Admitting: Internal Medicine

## 2015-11-19 VITALS — BP 98/68 | HR 66 | Ht 68.0 in | Wt 194.4 lb

## 2015-11-19 DIAGNOSIS — E663 Overweight: Secondary | ICD-10-CM

## 2015-11-19 DIAGNOSIS — Z1211 Encounter for screening for malignant neoplasm of colon: Secondary | ICD-10-CM | POA: Insufficient documentation

## 2015-11-19 DIAGNOSIS — Z01419 Encounter for gynecological examination (general) (routine) without abnormal findings: Secondary | ICD-10-CM | POA: Diagnosis not present

## 2015-11-19 DIAGNOSIS — Z0000 Encounter for general adult medical examination without abnormal findings: Secondary | ICD-10-CM | POA: Insufficient documentation

## 2015-11-19 DIAGNOSIS — Z1151 Encounter for screening for human papillomavirus (HPV): Secondary | ICD-10-CM | POA: Insufficient documentation

## 2015-11-19 MED ORDER — PHENTERMINE HCL 37.5 MG PO CAPS: 37.5000 mg | ORAL_CAPSULE | ORAL | Status: DC

## 2015-11-19 NOTE — Assessment & Plan Note (Addendum)
Wt Readings from Last 3 Encounters:  11/19/15 194 lb 6.4 oz (88.179 kg)  09/17/15 190 lb 12.8 oz (86.546 kg)  05/13/15 185 lb (83.915 kg)   Body mass index is 29.57 kg/(m^2). Encouraged healthy diet and exercise. Will start phentermine to help with appetite, as this worked well for her in the past. She understands the risks of this medication.Follow up recheck in 4 weeks.

## 2015-11-19 NOTE — Patient Instructions (Signed)
Health Maintenance, Female Adopting a healthy lifestyle and getting preventive care can go a long way to promote health and wellness. Talk with your health care provider about what schedule of regular examinations is right for you. This is a good chance for you to check in with your provider about disease prevention and staying healthy. In between checkups, there are plenty of things you can do on your own. Experts have done a lot of research about which lifestyle changes and preventive measures are most likely to keep you healthy. Ask your health care provider for more information. WEIGHT AND DIET  Eat a healthy diet  Be sure to include plenty of vegetables, fruits, low-fat dairy products, and lean protein.  Do not eat a lot of foods high in solid fats, added sugars, or salt.  Get regular exercise. This is one of the most important things you can do for your health.  Most adults should exercise for at least 150 minutes each week. The exercise should increase your heart rate and make you sweat (moderate-intensity exercise).  Most adults should also do strengthening exercises at least twice a week. This is in addition to the moderate-intensity exercise.  Maintain a healthy weight  Body mass index (BMI) is a measurement that can be used to identify possible weight problems. It estimates body fat based on height and weight. Your health care provider can help determine your BMI and help you achieve or maintain a healthy weight.  For females 20 years of age and older:   A BMI below 18.5 is considered underweight.  A BMI of 18.5 to 24.9 is normal.  A BMI of 25 to 29.9 is considered overweight.  A BMI of 30 and above is considered obese.  Watch levels of cholesterol and blood lipids  You should start having your blood tested for lipids and cholesterol at 42 years of age, then have this test every 5 years.  You may need to have your cholesterol levels checked more often if:  Your lipid  or cholesterol levels are high.  You are older than 42 years of age.  You are at high risk for heart disease.  CANCER SCREENING   Lung Cancer  Lung cancer screening is recommended for adults 55-80 years old who are at high risk for lung cancer because of a history of smoking.  A yearly low-dose CT scan of the lungs is recommended for people who:  Currently smoke.  Have quit within the past 15 years.  Have at least a 30-pack-year history of smoking. A pack year is smoking an average of one pack of cigarettes a day for 1 year.  Yearly screening should continue until it has been 15 years since you quit.  Yearly screening should stop if you develop a health problem that would prevent you from having lung cancer treatment.  Breast Cancer  Practice breast self-awareness. This means understanding how your breasts normally appear and feel.  It also means doing regular breast self-exams. Let your health care provider know about any changes, no matter how small.  If you are in your 20s or 30s, you should have a clinical breast exam (CBE) by a health care provider every 1-3 years as part of a regular health exam.  If you are 40 or older, have a CBE every year. Also consider having a breast X-ray (mammogram) every year.  If you have a family history of breast cancer, talk to your health care provider about genetic screening.  If you   are at high risk for breast cancer, talk to your health care provider about having an MRI and a mammogram every year.  Breast cancer gene (BRCA) assessment is recommended for women who have family members with BRCA-related cancers. BRCA-related cancers include:  Breast.  Ovarian.  Tubal.  Peritoneal cancers.  Results of the assessment will determine the need for genetic counseling and BRCA1 and BRCA2 testing. Cervical Cancer Your health care provider may recommend that you be screened regularly for cancer of the pelvic organs (ovaries, uterus, and  vagina). This screening involves a pelvic examination, including checking for microscopic changes to the surface of your cervix (Pap test). You may be encouraged to have this screening done every 3 years, beginning at age 21.  For women ages 30-65, health care providers may recommend pelvic exams and Pap testing every 3 years, or they may recommend the Pap and pelvic exam, combined with testing for human papilloma virus (HPV), every 5 years. Some types of HPV increase your risk of cervical cancer. Testing for HPV may also be done on women of any age with unclear Pap test results.  Other health care providers may not recommend any screening for nonpregnant women who are considered low risk for pelvic cancer and who do not have symptoms. Ask your health care provider if a screening pelvic exam is right for you.  If you have had past treatment for cervical cancer or a condition that could lead to cancer, you need Pap tests and screening for cancer for at least 20 years after your treatment. If Pap tests have been discontinued, your risk factors (such as having a new sexual partner) need to be reassessed to determine if screening should resume. Some women have medical problems that increase the chance of getting cervical cancer. In these cases, your health care provider may recommend more frequent screening and Pap tests. Colorectal Cancer  This type of cancer can be detected and often prevented.  Routine colorectal cancer screening usually begins at 42 years of age and continues through 42 years of age.  Your health care provider may recommend screening at an earlier age if you have risk factors for colon cancer.  Your health care provider may also recommend using home test kits to check for hidden blood in the stool.  A small camera at the end of a tube can be used to examine your colon directly (sigmoidoscopy or colonoscopy). This is done to check for the earliest forms of colorectal  cancer.  Routine screening usually begins at age 50.  Direct examination of the colon should be repeated every 5-10 years through 42 years of age. However, you may need to be screened more often if early forms of precancerous polyps or small growths are found. Skin Cancer  Check your skin from head to toe regularly.  Tell your health care provider about any new moles or changes in moles, especially if there is a change in a mole's shape or color.  Also tell your health care provider if you have a mole that is larger than the size of a pencil eraser.  Always use sunscreen. Apply sunscreen liberally and repeatedly throughout the day.  Protect yourself by wearing long sleeves, pants, a wide-brimmed hat, and sunglasses whenever you are outside. HEART DISEASE, DIABETES, AND HIGH BLOOD PRESSURE   High blood pressure causes heart disease and increases the risk of stroke. High blood pressure is more likely to develop in:  People who have blood pressure in the high end   of the normal range (130-139/85-89 mm Hg).  People who are overweight or obese.  People who are African American.  If you are 38-23 years of age, have your blood pressure checked every 3-5 years. If you are 61 years of age or older, have your blood pressure checked every year. You should have your blood pressure measured twice--once when you are at a hospital or clinic, and once when you are not at a hospital or clinic. Record the average of the two measurements. To check your blood pressure when you are not at a hospital or clinic, you can use:  An automated blood pressure machine at a pharmacy.  A home blood pressure monitor.  If you are between 45 years and 39 years old, ask your health care provider if you should take aspirin to prevent strokes.  Have regular diabetes screenings. This involves taking a blood sample to check your fasting blood sugar level.  If you are at a normal weight and have a low risk for diabetes,  have this test once every three years after 42 years of age.  If you are overweight and have a high risk for diabetes, consider being tested at a younger age or more often. PREVENTING INFECTION  Hepatitis B  If you have a higher risk for hepatitis B, you should be screened for this virus. You are considered at high risk for hepatitis B if:  You were born in a country where hepatitis B is common. Ask your health care provider which countries are considered high risk.  Your parents were born in a high-risk country, and you have not been immunized against hepatitis B (hepatitis B vaccine).  You have HIV or AIDS.  You use needles to inject street drugs.  You live with someone who has hepatitis B.  You have had sex with someone who has hepatitis B.  You get hemodialysis treatment.  You take certain medicines for conditions, including cancer, organ transplantation, and autoimmune conditions. Hepatitis C  Blood testing is recommended for:  Everyone born from [birth year] through 1967.  Anyone with known risk factors for hepatitis C. Sexually transmitted infections (STIs)  You should be screened for sexually transmitted infections (STIs) including gonorrhea and chlamydia if:  You are sexually active and are younger than 42 years of age.  You are older than 42 years of age and your health care provider tells you that you are at risk for this type of infection.  Your sexual activity has changed since you were last screened and you are at an increased risk for chlamydia or gonorrhea. Ask your health care provider if you are at risk.  If you do not have HIV, but are at risk, it may be recommended that you take a prescription medicine daily to prevent HIV infection. This is called pre-exposure prophylaxis (PrEP). You are considered at risk if:  You are sexually active and do not regularly use condoms or know the HIV status of your partner(s).  You take drugs by injection.  You are sexually  active with a partner who has HIV. Talk with your health care provider about whether you are at high risk of being infected with HIV. If you choose to begin PrEP, you should first be tested for HIV. You should then be tested every 3 months for as long as you are taking PrEP.  PREGNANCY   If you are premenopausal and you may become pregnant, ask your health care provider about preconception counseling.  If you may  become pregnant, take 400 to 800 micrograms (mcg) of folic acid every day.  If you want to prevent pregnancy, talk to your health care provider about birth control (contraception). OSTEOPOROSIS AND MENOPAUSE   Osteoporosis is a disease in which the bones lose minerals and strength with aging. This can result in serious bone fractures. Your risk for osteoporosis can be identified using a bone density scan.  If you are 61 years of age or older, or if you are at risk for osteoporosis and fractures, ask your health care provider if you should be screened.  Ask your health care provider whether you should take a calcium or vitamin D supplement to lower your risk for osteoporosis.  Menopause may have certain physical symptoms and risks.  Hormone replacement therapy may reduce some of these symptoms and risks. Talk to your health care provider about whether hormone replacement therapy is right for you.  HOME CARE INSTRUCTIONS   Schedule regular health, dental, and eye exams.  Stay current with your immunizations.   Do not use any tobacco products including cigarettes, chewing tobacco, or electronic cigarettes.  If you are pregnant, do not drink alcohol.  If you are breastfeeding, limit how much and how often you drink alcohol.  Limit alcohol intake to no more than 1 drink per day for nonpregnant women. One drink equals 12 ounces of beer, 5 ounces of wine, or 1 ounces of hard liquor.  Do not use street drugs.  Do not share needles.  Ask your health care provider for help if  you need support or information about quitting drugs.  Tell your health care provider if you often feel depressed.  Tell your health care provider if you have ever been abused or do not feel safe at home.   This information is not intended to replace advice given to you by your health care provider. Make sure you discuss any questions you have with your health care provider.   Document Released: 11/23/2010 Document Revised: 05/31/2014 Document Reviewed: 04/11/2013 Elsevier Interactive Patient Education [employer].

## 2015-11-19 NOTE — Progress Notes (Signed)
Subjective:    Patient ID: Alexis Little, female    DOB: [birth year], 40 y.o.   MRN: [id number]  HPI  40YO female presents for physical exam.  Generally feeling well. Concerned about weight gain. Notes some dietary indiscretion. Trying to stay active, but time limited with childcare issues. Previously used phentermine to help with appetite, and would like to restart this.  Wt Readings from Last 3 Encounters:  11/19/15 194 lb 6.4 oz (88.179 kg)  09/17/15 190 lb 12.8 oz (86.546 kg)  05/13/15 185 lb (83.915 kg)   BP Readings from Last 3 Encounters:  11/19/15 98/68  09/17/15 106/64  05/13/15 103/67    Past Medical History  Diagnosis Date  . Hyperlipidemia   . History of kidney stones   . Squamous cell carcinoma, arm     Followed by Dr. Evorn Gong   Family History  Problem Relation Age of Onset  . Cancer Father     colon cancer or esophageal, widely metastatic  . Heart disease Father     AIFB  . Cancer Paternal Grandfather     lung cancer   . Cancer Paternal Aunt     pancreatic and colon  . Breast cancer Paternal Aunt    Past Surgical History  Procedure Laterality Date  . Kidney stone surgery    . Kidney stone surgery      surgical removal, Dr. Pearlie Oyster age 25  . Vaginal delivery      2   Social History   Social History  . Marital Status: Divorced    Spouse Name: N/A  . Number of Children: N/A  . Years of Education: N/A   Social History Main Topics  . Smoking status: Never Smoker   . Smokeless tobacco: Never Used  . Alcohol Use: No  . Drug Use: No  . Sexual Activity: Not Asked   Other Topics Concern  . None   Social History Narrative   Lives in [place] with fiance and three children. 1 dog in home.      Work - Pharmacologist, Tax adviser.      Diet - regular diet      Exercise - started walking daily    Review of Systems  Constitutional: Negative for fever, chills, appetite change, fatigue and unexpected weight change.  HENT: Negative  for congestion, sinus pressure and sore throat.   Eyes: Negative for visual disturbance.  Respiratory: Negative for shortness of breath.   Cardiovascular: Negative for chest pain and leg swelling.  Gastrointestinal: Negative for nausea, vomiting, abdominal pain, diarrhea and constipation.  Genitourinary: Negative for vaginal bleeding, vaginal discharge, vaginal pain and pelvic pain.  Musculoskeletal: Negative for myalgias and arthralgias.  Skin: Negative for color change and rash.  Neurological: Negative for weakness.  Hematological: Negative for adenopathy. Does not bruise/bleed easily.  Psychiatric/Behavioral: Negative for suicidal ideas, sleep disturbance and dysphoric mood. The patient is not nervous/anxious.        Objective:    BP 98/68 mmHg  Pulse 66  Ht 5' 8"  (1.727 m)  Wt 194 lb 6.4 oz (88.179 kg)  BMI 29.57 kg/m2  SpO2 99%  LMP 11/05/2015 Physical Exam  Constitutional: She is oriented to person, place, and time. She appears well-developed and well-nourished. No distress.  HENT:  Head: Normocephalic and atraumatic.  Right Ear: External ear normal.  Left Ear: External ear normal.  Nose: Nose normal.  Mouth/Throat: Oropharynx is clear and moist. No oropharyngeal exudate.  Eyes: Conjunctivae are normal. Pupils are equal,  round, and reactive to light. Right eye exhibits no discharge. Left eye exhibits no discharge. No scleral icterus.  Neck: Normal range of motion. Neck supple. No tracheal deviation present. No thyromegaly present.  Cardiovascular: Normal rate, regular rhythm, normal heart sounds and intact distal pulses.  Exam reveals no gallop and no friction rub.   No murmur heard. Pulmonary/Chest: Effort normal and breath sounds normal. No respiratory distress. She has no wheezes. She has no rales. She exhibits no tenderness.  Abdominal: Soft. Bowel sounds are normal. She exhibits no distension and no mass. There is no tenderness. There is no rebound and no guarding.    Genitourinary: Rectum normal, vagina normal and uterus normal. No breast swelling, tenderness, discharge or bleeding. Pelvic exam was performed with patient supine. There is no rash, tenderness or lesion on the right labia. There is no rash, tenderness or lesion on the left labia. Uterus is not enlarged and not tender. Cervix exhibits no motion tenderness, no discharge and no friability. Right adnexum displays no mass, no tenderness and no fullness. Left adnexum displays no mass, no tenderness and no fullness. No erythema or tenderness in the vagina. No vaginal discharge found.  Musculoskeletal: Normal range of motion. She exhibits no edema or tenderness.  Lymphadenopathy:    She has no cervical adenopathy.  Neurological: She is alert and oriented to person, place, and time. No cranial nerve deficit. She exhibits normal muscle tone. Coordination normal.  Skin: Skin is warm and dry. No rash noted. She is not diaphoretic. No erythema. No pallor.  Psychiatric: She has a normal mood and affect. Her behavior is normal. Judgment and thought content normal.          Assessment & Plan:   Problem List Items Addressed This Visit      Unprioritized   Overweight (BMI 25.0-29.9)    Wt Readings from Last 3 Encounters:  11/19/15 194 lb 6.4 oz (88.179 kg)  09/17/15 190 lb 12.8 oz (86.546 kg)  05/13/15 185 lb (83.915 kg)   Body mass index is 29.57 kg/(m^2). Encouraged healthy diet and exercise. Will start phentermine to help with appetite, as this worked well for her in the past. She understands the risks of this medication.Follow up recheck in 4 weeks.      Routine general medical examination at a health care facility - Primary    General medical exam normal today including breast and pelvic exam. PAP pending. Labs ordered. Encouraged healthy diet and exercise. Mammogram UTD and reviewed. Discussed Myriad testing, given her strong family history of malignancy. She will look into this.      Relevant  Orders   CBC with Differential/Platelet   Comprehensive metabolic panel   Lipid panel   Microalbumin / creatinine urine ratio   VITAMIN D 25 Hydroxy (Vit-D Deficiency, Fractures)   TSH   Cytology - PAP       Return in about 4 weeks (around 12/17/2015) for Recheck.  Ronette Deter, MD [facility]

## 2015-11-19 NOTE — Progress Notes (Signed)
Pre visit review using our clinic review tool, if applicable. No additional management support is needed unless otherwise documented below in the visit note. 

## 2015-11-19 NOTE — Assessment & Plan Note (Signed)
General medical exam normal today including breast and pelvic exam. PAP pending. Labs ordered. Encouraged healthy diet and exercise. Mammogram UTD and reviewed. Discussed Myriad testing, given her strong family history of malignancy. She will look into this.

## 2015-11-26 LAB — CYTOLOGY - PAP

## 2015-11-27 ENCOUNTER — Telehealth: Payer: Self-pay | Admitting: Internal Medicine

## 2015-11-27 ENCOUNTER — Other Ambulatory Visit: Payer: Self-pay

## 2015-11-27 MED ORDER — FLUCONAZOLE 150 MG PO TABS: 150.0000 mg | ORAL_TABLET | Freq: Once | ORAL | Status: DC

## 2015-11-27 NOTE — Telephone Encounter (Signed)
Spoke with patient regarding results, see result note. thanks

## 2015-11-27 NOTE — Telephone Encounter (Signed)
Pt called back regarding lab results.  Call pt @ [phone]. Thank you!

## 2015-12-02 DIAGNOSIS — Z1379 Encounter for other screening for genetic and chromosomal anomalies: Secondary | ICD-10-CM | POA: Diagnosis not present

## 2015-12-02 DIAGNOSIS — Z1321 Encounter for screening for nutritional disorder: Secondary | ICD-10-CM | POA: Diagnosis not present

## 2015-12-02 DIAGNOSIS — Z315 Encounter for genetic counseling: Secondary | ICD-10-CM | POA: Diagnosis not present

## 2015-12-02 DIAGNOSIS — Z01419 Encounter for gynecological examination (general) (routine) without abnormal findings: Secondary | ICD-10-CM | POA: Diagnosis not present

## 2015-12-16 ENCOUNTER — Other Ambulatory Visit (INDEPENDENT_AMBULATORY_CARE_PROVIDER_SITE_OTHER): Payer: BLUE CROSS/BLUE SHIELD

## 2015-12-16 DIAGNOSIS — Z0000 Encounter for general adult medical examination without abnormal findings: Secondary | ICD-10-CM | POA: Diagnosis not present

## 2015-12-16 LAB — CBC WITH DIFFERENTIAL/PLATELET
BASOS ABS: 0 10*3/uL (ref 0.0–0.1)
Basophils Relative: 0.8 % (ref 0.0–3.0)
Eosinophils Absolute: 0.2 10*3/uL (ref 0.0–0.7)
Eosinophils Relative: 5.1 % — ABNORMAL HIGH (ref 0.0–5.0)
HCT: 42.1 % (ref 36.0–46.0)
Hemoglobin: 14.2 g/dL (ref 12.0–15.0)
LYMPHS ABS: 1 10*3/uL (ref 0.7–4.0)
Lymphocytes Relative: 27.3 % (ref 12.0–46.0)
MCHC: 33.8 g/dL (ref 30.0–36.0)
MCV: 87.8 fl (ref 78.0–100.0)
MONO ABS: 0.3 10*3/uL (ref 0.1–1.0)
Monocytes Relative: 7.4 % (ref 3.0–12.0)
NEUTROS PCT: 59.4 % (ref 43.0–77.0)
Neutro Abs: 2.1 10*3/uL (ref 1.4–7.7)
Platelets: 244 10*3/uL (ref 150.0–400.0)
RBC: 4.79 Mil/uL (ref 3.87–5.11)
RDW: 12 % (ref 11.5–15.5)
WBC: 3.6 10*3/uL — AB (ref 4.0–10.5)

## 2015-12-16 LAB — COMPREHENSIVE METABOLIC PANEL
ALBUMIN: 4.1 g/dL (ref 3.5–5.2)
ALK PHOS: 45 U/L (ref 39–117)
ALT: 10 U/L (ref 0–35)
AST: 13 U/L (ref 0–37)
BILIRUBIN TOTAL: 0.7 mg/dL (ref 0.2–1.2)
BUN: 11 mg/dL (ref 6–23)
CALCIUM: 9.2 mg/dL (ref 8.4–10.5)
CHLORIDE: 107 meq/L (ref 96–112)
CO2: 29 mEq/L (ref 19–32)
CREATININE: 0.81 mg/dL (ref 0.40–1.20)
GFR: 82.56 mL/min (ref >60.00)
Glucose, Bld: 79 mg/dL (ref 70–99)
Potassium: 4.5 mEq/L (ref 3.5–5.1)
Sodium: 140 mEq/L (ref 135–145)
TOTAL PROTEIN: 6.3 g/dL (ref 6.0–8.3)

## 2015-12-16 LAB — MICROALBUMIN / CREATININE URINE RATIO
CREATININE, U: 165.9 mg/dL
MICROALB/CREAT RATIO: 0.4 mg/g (ref 0.0–30.0)

## 2015-12-16 LAB — LIPID PANEL
CHOLESTEROL: 166 mg/dL (ref 0–200)
HDL: 48.9 mg/dL (ref >39.00)
LDL Cholesterol: 106 mg/dL — ABNORMAL HIGH (ref 0–99)
NonHDL: 117.02
TRIGLYCERIDES: 56 mg/dL (ref 0.0–149.0)
Total CHOL/HDL Ratio: 3
VLDL: 11.2 mg/dL (ref 0.0–40.0)

## 2015-12-16 LAB — TSH: TSH: 1.1 u[IU]/mL (ref 0.35–4.50)

## 2015-12-16 NOTE — Addendum Note (Signed)
Addended by: Frutoso Chase A on: 12/16/2015 09:06 AM   Modules accepted: Orders

## 2015-12-18 ENCOUNTER — Ambulatory Visit (INDEPENDENT_AMBULATORY_CARE_PROVIDER_SITE_OTHER): Payer: BLUE CROSS/BLUE SHIELD | Admitting: Internal Medicine

## 2015-12-18 ENCOUNTER — Encounter: Payer: Self-pay | Admitting: Internal Medicine

## 2015-12-18 VITALS — BP 92/64 | HR 70 | Ht 68.0 in | Wt 187.0 lb

## 2015-12-18 DIAGNOSIS — R5382 Chronic fatigue, unspecified: Secondary | ICD-10-CM | POA: Diagnosis not present

## 2015-12-18 DIAGNOSIS — D72819 Decreased white blood cell count, unspecified: Secondary | ICD-10-CM | POA: Diagnosis not present

## 2015-12-18 DIAGNOSIS — E663 Overweight: Secondary | ICD-10-CM

## 2015-12-18 LAB — CBC WITH DIFFERENTIAL/PLATELET
Basophils Absolute: 0 10*3/uL (ref 0.0–0.1)
Basophils Relative: 0.4 % (ref 0.0–3.0)
EOS ABS: 0.2 10*3/uL (ref 0.0–0.7)
Eosinophils Relative: 3.6 % (ref 0.0–5.0)
HEMATOCRIT: 44 % (ref 36.0–46.0)
HEMOGLOBIN: 14.8 g/dL (ref 12.0–15.0)
LYMPHS PCT: 25.1 % (ref 12.0–46.0)
Lymphs Abs: 1.3 10*3/uL (ref 0.7–4.0)
MCHC: 33.6 g/dL (ref 30.0–36.0)
MCV: 88.3 fl (ref 78.0–100.0)
MONO ABS: 0.4 10*3/uL (ref 0.1–1.0)
Monocytes Relative: 7.4 % (ref 3.0–12.0)
Neutro Abs: 3.3 10*3/uL (ref 1.4–7.7)
Neutrophils Relative %: 63.5 % (ref 43.0–77.0)
Platelets: 267 10*3/uL (ref 150.0–400.0)
RBC: 4.99 Mil/uL (ref 3.87–5.11)
RDW: 12.4 % (ref 11.5–15.5)
WBC: 5.2 10*3/uL (ref 4.0–10.5)

## 2015-12-18 LAB — VITAMIN B12: Vitamin B-12: 269 pg/mL (ref 211–911)

## 2015-12-18 NOTE — Progress Notes (Signed)
Subjective:    Patient ID: Alexis Little, female    DOB: [birth year], 40 y.o.   MRN: [id number]  HPI  40YO female presents for follow up.  Last seen in June for follow up. Started on Phentermine to help with appetite and weight loss. Notes some improvement in appetite. Tolerating medication well. Notes more energy on phentermine.  Continues however to feel tired, particularly in the morning. Recent labs including CMP and TSH were normal. CBC showed low WBC at 3.6. No other symptoms noted. Sleeping well at night from 10:30pm until about 7:30am or even to 9am on the weekends.     Body mass index is 28.43 kg/m.   Wt Readings from Last 3 Encounters:  12/18/15 187 lb (84.8 kg)  11/19/15 194 lb 6.4 oz (88.2 kg)  09/17/15 190 lb 12.8 oz (86.5 kg)   BP Readings from Last 3 Encounters:  12/18/15 92/64  11/19/15 98/68  09/17/15 106/64    Past Medical History:  Diagnosis Date  . History of kidney stones   . Hyperlipidemia   . Squamous cell carcinoma, arm    Followed by Dr. Evorn Gong   Family History  Problem Relation Age of Onset  . Cancer Father     colon cancer or esophageal, widely metastatic  . Heart disease Father     AIFB  . Cancer Paternal Grandfather     lung cancer   . Cancer Paternal Aunt     pancreatic and colon  . Breast cancer Paternal Aunt    Past Surgical History:  Procedure Laterality Date  . KIDNEY STONE SURGERY    . KIDNEY STONE SURGERY     surgical removal, Dr. Pearlie Oyster age 37  . VAGINAL DELIVERY     2   Social History   Social History  . Marital status: Divorced    Spouse name: N/A  . Number of children: N/A  . Years of education: N/A   Social History Main Topics  . Smoking status: Never Smoker  . Smokeless tobacco: Never Used  . Alcohol use No  . Drug use: No  . Sexual activity: Not Asked   Other Topics Concern  . None   Social History Narrative   Lives in [place] with fiance and three children. 1 dog in home.      Work - Medical illustrator, Tax adviser.      Diet - regular diet      Exercise - started walking daily    Review of Systems  Constitutional: Positive for fatigue. Negative for appetite change, chills, fever and unexpected weight change.  Eyes: Negative for visual disturbance.  Respiratory: Negative for shortness of breath.   Cardiovascular: Negative for chest pain and leg swelling.  Gastrointestinal: Negative for abdominal pain, constipation, diarrhea, nausea and vomiting.  Musculoskeletal: Negative for arthralgias and myalgias.  Skin: Negative for color change and rash.  Hematological: Negative for adenopathy. Does not bruise/bleed easily.  Psychiatric/Behavioral: Negative for dysphoric mood and sleep disturbance. The patient is not nervous/anxious.        Objective:    BP 92/64 (BP Location: Left Arm, Patient Position: Sitting, Cuff Size: Large)   Pulse 70   Ht 5\' 8"  (1.727 m)   Wt 187 lb (84.8 kg)   LMP 11/05/2015   SpO2 98%   BMI 28.43 kg/m  Physical Exam  Constitutional: She is oriented to person, place, and time. She appears well-developed and well-nourished. No distress.  HENT:  Head: Normocephalic and atraumatic.  Right  Ear: External ear normal.  Left Ear: External ear normal.  Nose: Nose normal.  Mouth/Throat: Oropharynx is clear and moist. No oropharyngeal exudate.  Eyes: Conjunctivae are normal. Pupils are equal, round, and reactive to light. Right eye exhibits no discharge. Left eye exhibits no discharge. No scleral icterus.  Neck: Normal range of motion. Neck supple. No tracheal deviation present. No thyromegaly present.  Cardiovascular: Normal rate, regular rhythm, normal heart sounds and intact distal pulses.  Exam reveals no gallop and no friction rub.   No murmur heard. Pulmonary/Chest: Effort normal and breath sounds normal. No respiratory distress. She has no wheezes. She has no rales. She exhibits no tenderness.  Musculoskeletal: Normal range of motion. She  exhibits no edema or tenderness.  Lymphadenopathy:    She has no cervical adenopathy.  Neurological: She is alert and oriented to person, place, and time. No cranial nerve deficit. She exhibits normal muscle tone. Coordination normal.  Skin: Skin is warm and dry. No rash noted. She is not diaphoretic. No erythema. No pallor.  Psychiatric: She has a normal mood and affect. Her behavior is normal. Judgment and thought content normal.          Assessment & Plan:   Problem List Items Addressed This Visit      Unprioritized   Chronic fatigue    Recent fatigue. Labs remarkable for leukopenia. Will repeat CBC and B12 with labs today. Discussed adding a sleep study if labs normal. Follow up in 4 weeks.      Relevant Orders   B12   Leukopenia    WBC slightly low on recent labs. Likely viral suppression. Will repeat CBC with diff today.      Relevant Orders   CBC w/Diff   Overweight (BMI 25.0-29.9) - Primary    Wt Readings from Last 3 Encounters:  12/18/15 187 lb (84.8 kg)  11/19/15 194 lb 6.4 oz (88.2 kg)  09/17/15 190 lb 12.8 oz (86.5 kg)   Congratulated pt on weight loss. Continue phentermine for 3 months. Continue healthy diet and exercise.       Other Visit Diagnoses   None.      Return in about 4 weeks (around 01/15/2016) for New Patient.  Ronette Deter, MD Internal [facility]

## 2015-12-18 NOTE — Assessment & Plan Note (Signed)
Wt Readings from Last 3 Encounters:  12/18/15 187 lb (84.8 kg)  11/19/15 194 lb 6.4 oz (88.2 kg)  09/17/15 190 lb 12.8 oz (86.5 kg)   Congratulated pt on weight loss. Continue phentermine for 3 months. Continue healthy diet and exercise.

## 2015-12-18 NOTE — Patient Instructions (Signed)
Labs today.  Follow up with new provider in 4 weeks.

## 2015-12-18 NOTE — Assessment & Plan Note (Signed)
WBC slightly low on recent labs. Likely viral suppression. Will repeat CBC with diff today.

## 2015-12-18 NOTE — Assessment & Plan Note (Signed)
Recent fatigue. Labs remarkable for leukopenia. Will repeat CBC and B12 with labs today. Discussed adding a sleep study if labs normal. Follow up in 4 weeks.

## 2015-12-23 ENCOUNTER — Ambulatory Visit (INDEPENDENT_AMBULATORY_CARE_PROVIDER_SITE_OTHER): Payer: BLUE CROSS/BLUE SHIELD

## 2015-12-23 DIAGNOSIS — E538 Deficiency of other specified B group vitamins: Secondary | ICD-10-CM

## 2015-12-23 MED ORDER — CYANOCOBALAMIN 1000 MCG/ML IJ SOLN
1000.0000 ug | Freq: Once | INTRAMUSCULAR | Status: AC
  Administered 2015-12-23: 1000 ug via INTRAMUSCULAR

## 2015-12-23 NOTE — Progress Notes (Signed)
Patient was in today receiving a B12 injection in her left deltoid. Patient tolerated well. 

## 2015-12-30 ENCOUNTER — Ambulatory Visit (INDEPENDENT_AMBULATORY_CARE_PROVIDER_SITE_OTHER): Payer: BLUE CROSS/BLUE SHIELD

## 2015-12-30 DIAGNOSIS — E538 Deficiency of other specified B group vitamins: Secondary | ICD-10-CM | POA: Diagnosis not present

## 2015-12-30 MED ORDER — CYANOCOBALAMIN 1000 MCG/ML IJ SOLN
1000.0000 ug | Freq: Once | INTRAMUSCULAR | Status: AC
  Administered 2015-12-30: 1000 ug via INTRAMUSCULAR

## 2015-12-30 NOTE — Progress Notes (Signed)
Patient was in today receiving a B12 injection in the Right deltoid. Patient tolerated well.

## 2016-01-06 ENCOUNTER — Ambulatory Visit (INDEPENDENT_AMBULATORY_CARE_PROVIDER_SITE_OTHER): Payer: BLUE CROSS/BLUE SHIELD

## 2016-01-06 DIAGNOSIS — E538 Deficiency of other specified B group vitamins: Secondary | ICD-10-CM

## 2016-01-06 MED ORDER — CYANOCOBALAMIN 1000 MCG/ML IJ SOLN
1000.0000 ug | Freq: Once | INTRAMUSCULAR | Status: AC
  Administered 2016-01-06: 1000 ug via INTRAMUSCULAR

## 2016-01-06 NOTE — Progress Notes (Signed)
Patient was in receiving her 3rd B12 in her left deltoid. She will now go to monthly B12 injection.  Patient tolerated well.

## 2016-01-23 ENCOUNTER — Ambulatory Visit: Payer: Self-pay | Admitting: Internal Medicine

## 2016-02-10 ENCOUNTER — Ambulatory Visit (INDEPENDENT_AMBULATORY_CARE_PROVIDER_SITE_OTHER): Payer: BLUE CROSS/BLUE SHIELD | Admitting: *Deleted

## 2016-02-10 DIAGNOSIS — E538 Deficiency of other specified B group vitamins: Secondary | ICD-10-CM | POA: Diagnosis not present

## 2016-02-10 MED ORDER — CYANOCOBALAMIN 1000 MCG/ML IJ SOLN
1000.0000 ug | Freq: Once | INTRAMUSCULAR | Status: AC
  Administered 2016-02-10: 1000 ug via INTRAMUSCULAR

## 2016-02-10 NOTE — Progress Notes (Addendum)
Patient presents for B12 injection.  Injection given in right arm.  Patient tolerated well.  Reviewed.   Dr Nicki Reaper

## 2016-02-20 DIAGNOSIS — Z85828 Personal history of other malignant neoplasm of skin: Secondary | ICD-10-CM | POA: Diagnosis not present

## 2016-02-23 ENCOUNTER — Encounter: Payer: Self-pay | Admitting: Internal Medicine

## 2016-02-23 ENCOUNTER — Ambulatory Visit (INDEPENDENT_AMBULATORY_CARE_PROVIDER_SITE_OTHER): Payer: BLUE CROSS/BLUE SHIELD | Admitting: Internal Medicine

## 2016-02-23 VITALS — BP 92/70 | HR 67 | Temp 97.7°F | Ht 68.0 in | Wt 188.4 lb

## 2016-02-23 DIAGNOSIS — E559 Vitamin D deficiency, unspecified: Secondary | ICD-10-CM | POA: Diagnosis not present

## 2016-02-23 DIAGNOSIS — E663 Overweight: Secondary | ICD-10-CM

## 2016-02-23 DIAGNOSIS — R5382 Chronic fatigue, unspecified: Secondary | ICD-10-CM | POA: Diagnosis not present

## 2016-02-23 LAB — VITAMIN D 25 HYDROXY (VIT D DEFICIENCY, FRACTURES): VITD: 41.61 ng/mL (ref 30.00–100.00)

## 2016-02-23 MED ORDER — FLUCONAZOLE 150 MG PO TABS: 150.0000 mg | ORAL_TABLET | Freq: Every day | ORAL | 1 refills | Status: DC

## 2016-02-23 NOTE — Patient Instructions (Addendum)
  The  diet I discussed with you today is the 10 day Green Smoothie Cleansing /Detox Diet by Linden Dolin . available on [place] for around $10.  It does require a blender, (Vita Mix, a electric juicer,  Or a Nutribullet Rx).  This is not a low carb or a weight loss diet,  It is fundamentally a "cleansing" low fat diet that eliminates sugar, gluten, caffeine, alcohol and dairy for 10 days .  What you add back after the initial ten days is entirely up to  you!  You can expect to lose 5 to 10 lbs depending on how strict you are.   I found that  drinking 2 smoothies or juices  daily and keeping one chewable meal (but keep it simple, like baked fish and salad, rice or bok choy) kept me satisfied and kept me from straying  .  You snack primarily on fresh  fruit, egg whites and judicious quantities of nuts.  You can add a  vegetable based protein powder  to any smoothie made with almond milk (nothing with whey , since whey is dairy)  [employer] has a few but  the [employer] has the greatest  selection .  Using frozen fruits is much more convenient and cost effective. You can even find plenty of organic fruit in the frozen fruit section of [employer].  Just thaw what you need for the following day the night before in the refrigerator (to avoid jamming up your machine)   The organic vegan protein powder I tried  is called Vega" and I found it at [employer] .  It is sugar free. Tastes like crap.  My advice:  Sarina Ser your protein  (eat an egg or two in the am with your smoothie or add soy yogurt for protein ) ,  Don't ruin the taste of your smoothies with protein powder unless you can find one you really love.    Once you are able to schedule regular exercise and can straighten out the dietary regimen,  I will refill the phentermine  Checking your Vitamin D level today
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
## 2016-02-23 NOTE — Progress Notes (Signed)
Pre visit review using our clinic review tool, if applicable. No additional management support is needed unless otherwise documented below in the visit note. 

## 2016-02-24 DIAGNOSIS — E559 Vitamin D deficiency, unspecified: Secondary | ICD-10-CM | POA: Insufficient documentation

## 2016-02-24 NOTE — Assessment & Plan Note (Signed)
Weight has plateaued due to lack of regular exercise.  Suspending phentermine,  Dietary alternatives discussed.

## 2016-02-24 NOTE — Assessment & Plan Note (Addendum)
Multifactorial,  improved with B-12 supplementation . Thyroid normal , cbc normal  Lab Results  Component Value Date   TSH 1.10 12/16/2015   Lab Results  Component Value Date   WBC 5.2 12/18/2015   HGB 14.8 12/18/2015   HCT 44.0 12/18/2015   MCV 88.3 12/18/2015   PLT 267.0 12/18/2015

## 2016-02-24 NOTE — Assessment & Plan Note (Addendum)
Has been taking 5000 IUs D3 daily for initial level of 23 .  Lwvel is low normal at 41.  Continue 5,000 Ius daily

## 2016-03-16 ENCOUNTER — Ambulatory Visit (INDEPENDENT_AMBULATORY_CARE_PROVIDER_SITE_OTHER): Payer: BLUE CROSS/BLUE SHIELD

## 2016-03-16 DIAGNOSIS — E538 Deficiency of other specified B group vitamins: Secondary | ICD-10-CM

## 2016-03-16 MED ORDER — CYANOCOBALAMIN 1000 MCG/ML IJ SOLN
1000.0000 ug | Freq: Once | INTRAMUSCULAR | Status: AC
  Administered 2016-03-16: 1000 ug via INTRAMUSCULAR

## 2016-03-16 NOTE — Progress Notes (Addendum)
Patient came in for B 12 injection received in left arm tolerated well.     Reviewed.  Dr Nicki Reaper

## 2016-04-12 ENCOUNTER — Ambulatory Visit (INDEPENDENT_AMBULATORY_CARE_PROVIDER_SITE_OTHER): Payer: BLUE CROSS/BLUE SHIELD

## 2016-04-12 DIAGNOSIS — E538 Deficiency of other specified B group vitamins: Secondary | ICD-10-CM

## 2016-04-12 MED ORDER — CYANOCOBALAMIN 1000 MCG/ML IJ SOLN
1000.0000 ug | Freq: Once | INTRAMUSCULAR | Status: AC
  Administered 2016-04-12: 1000 ug via INTRAMUSCULAR

## 2016-04-12 NOTE — Progress Notes (Signed)
Patient comes in for B 12 injection.  Injected right deltoid.  Patient tolerated injection well.   

## 2016-04-20 IMAGING — US US BREAST*L* LIMITED INC AXILLA
1 series · 13 of 16 positions shown · non-contrast
Comparison: 12/30/2005

CLINICAL DATA: 37-year-old female, callback from screening
mammogram for possible left breast asymmetries

EXAM:
DIGITAL DIAGNOSTIC  LEFT MAMMOGRAM
ULTRASOUND LEFT BREAST

[Series 1: us breast*left* limited inc axilla · 0.08mm/px · 13 of 16 slices shown]
[im 1/16]
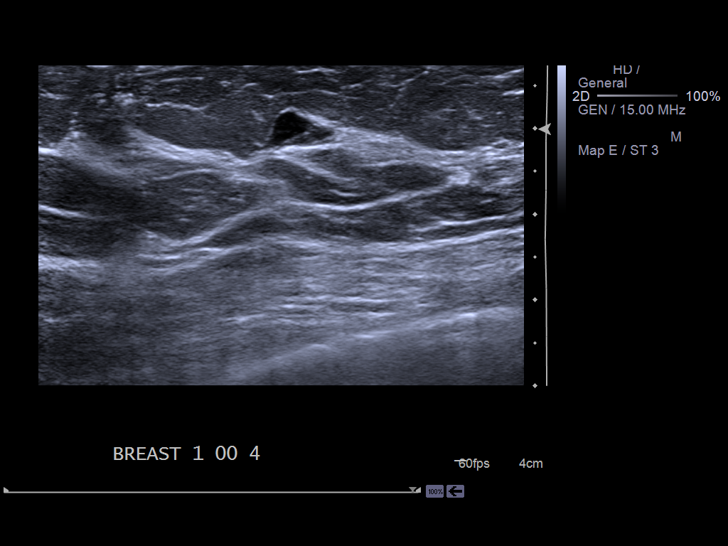
[im 2/16]
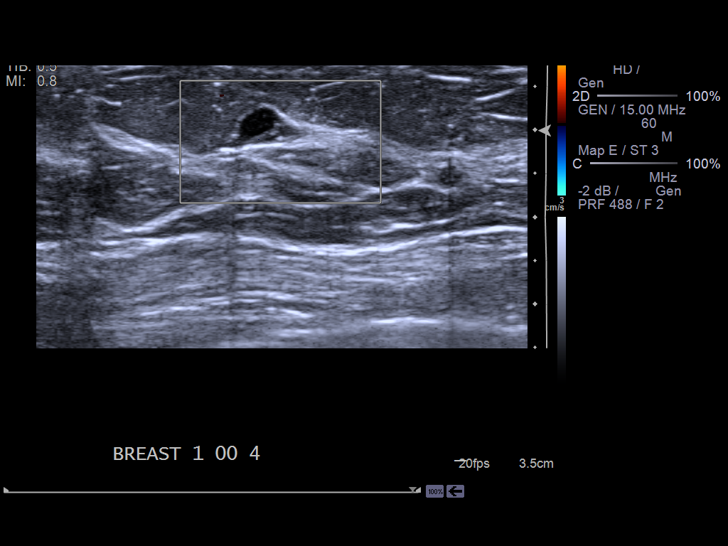
[im 4/16]
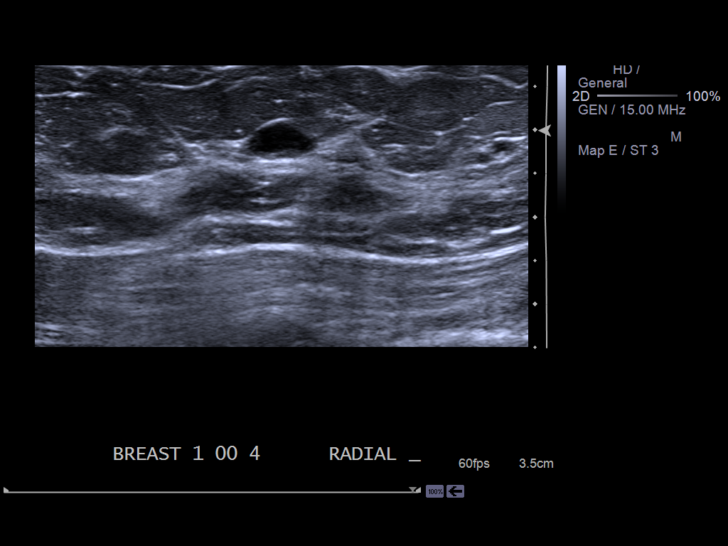
[im 5/16]
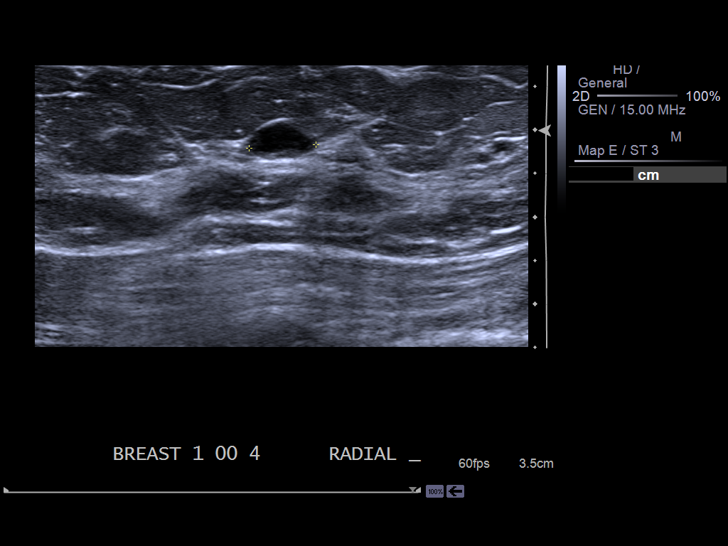
[im 6/16]
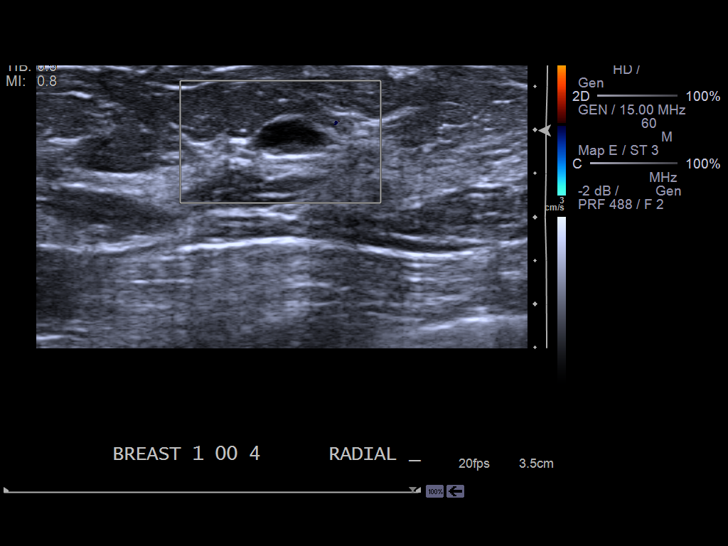
[im 7/16]
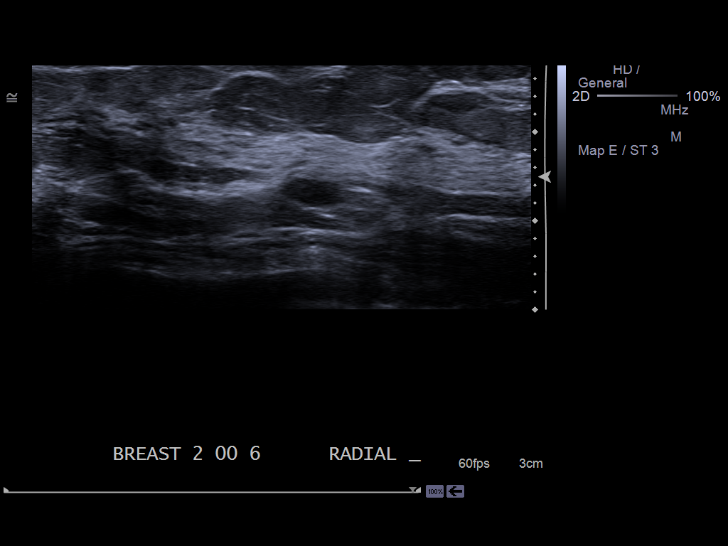
[im 9/16]
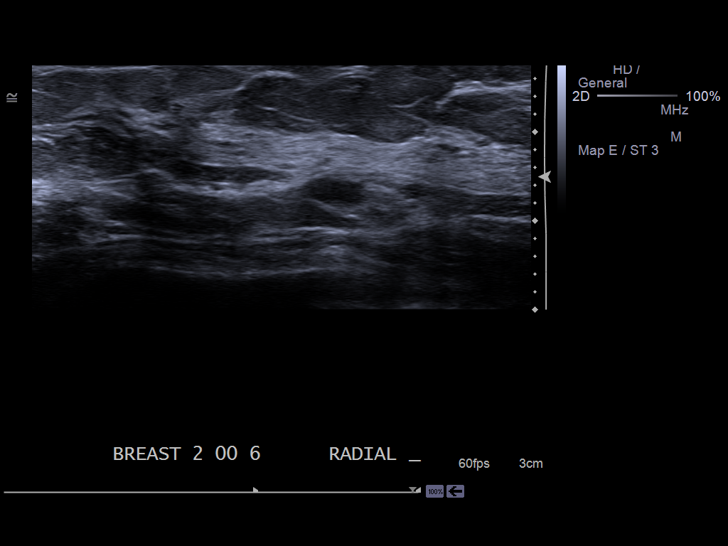
[im 10/16]
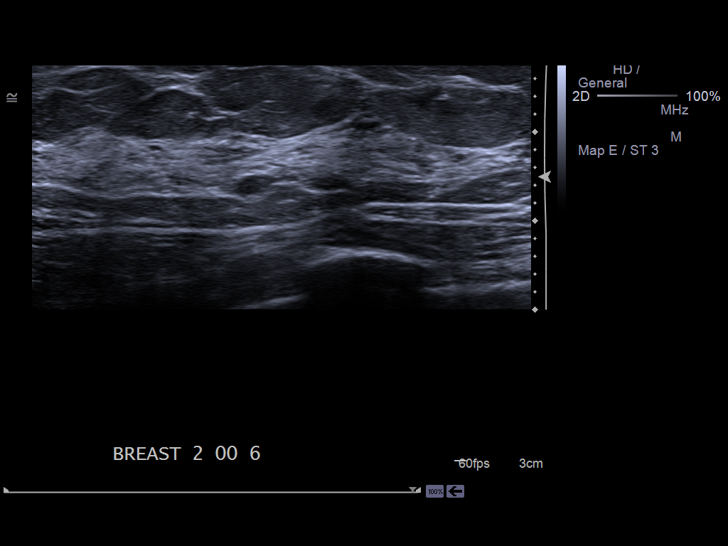
[im 11/16]
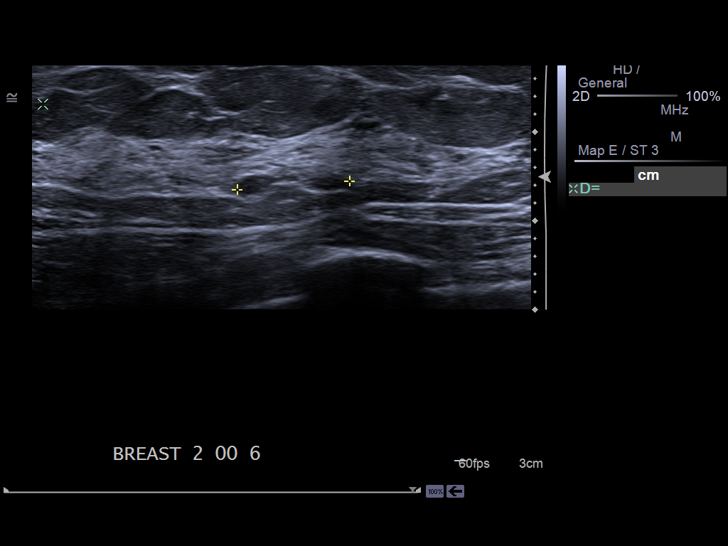
[im 12/16]
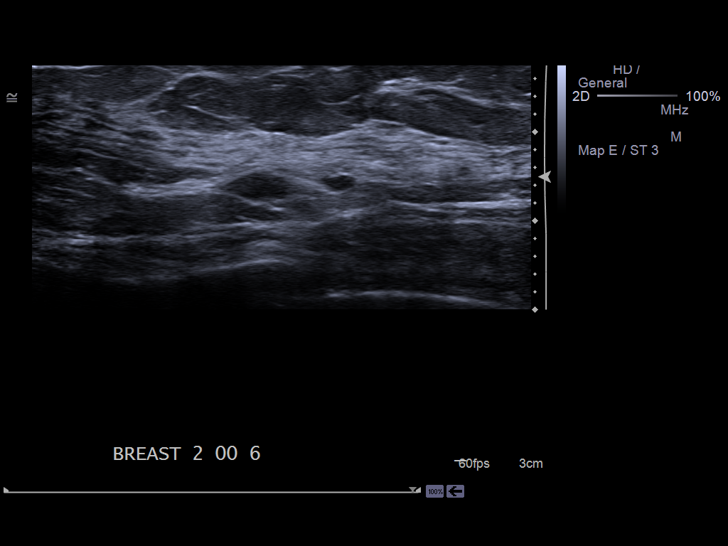
[im 13/16]
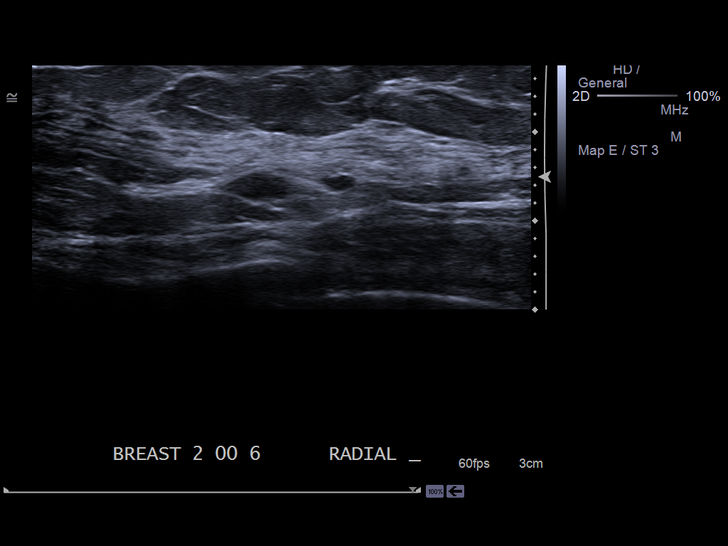
[im 15/16]
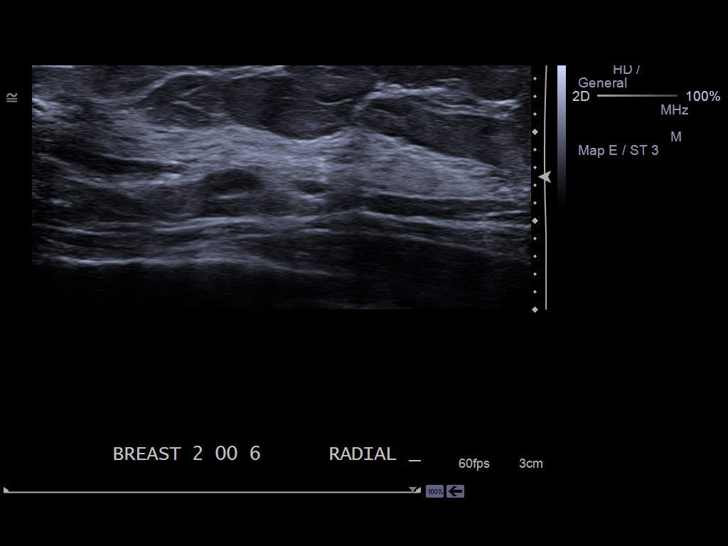
[im 16/16]
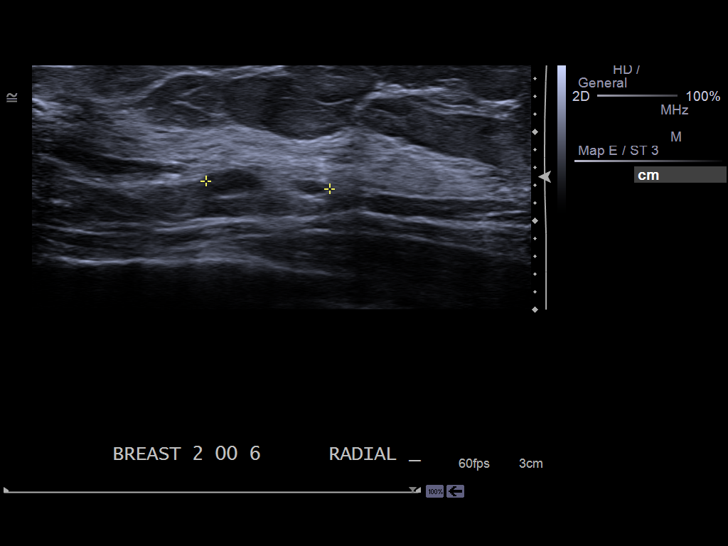

[13 of 16 positions shown; findings below may reference images not displayed]

ACR Breast Density Category c: The breast tissue is heterogeneously
dense, which may obscure small masses.
FINDINGS: On spot compression images of the left breast there is an 8 mm oval,
mass with partially circumscribed and partially obscured margins
within the upper, outer left breast, middle depth. Additionally,
there is the the suggestion of several additional smaller oval
masses within the posterior, lateral left breast.

On physical exam, no discrete mass is palpated within the upper,
outer left breast

Targeted ultrasound of the upper, outer left breast demonstrates a
complicated cyst at 1 o'clock, 4 cm from the nipple measuring 7 x 4
x 8 mm, likely corresponding to the 8 mm mass seen on mammogram. No
internal vascularity. At 2 o'clock, 6 cm from the nipple, there is a
probable cluster of cysts spanning 14 x 13 x 4 mm which is thought
to correspond to the area seen within the posterior, lateral left
breast on mammogram. No suspicious cystic or solid sonographic
finding was seen within the upper, outer left breast.
IMPRESSION: Probably benign left breast findings.

RECOMMENDATION:
Options including short term follow-up versus ultrasound-guided core
biopsy were discussed with the patient. The patient wishes to pursue
follow-up at this time.

I have discussed the findings and recommendations with the patient.
Results were also provided in writing at the conclusion of the
visit. If applicable, a reminder letter will be sent to the patient
regarding the next appointment.

BI-RADS CATEGORY  3: Probably benign.

## 2016-05-13 ENCOUNTER — Ambulatory Visit (INDEPENDENT_AMBULATORY_CARE_PROVIDER_SITE_OTHER): Payer: BLUE CROSS/BLUE SHIELD

## 2016-05-13 ENCOUNTER — Telehealth: Payer: Self-pay

## 2016-05-13 DIAGNOSIS — E538 Deficiency of other specified B group vitamins: Secondary | ICD-10-CM | POA: Diagnosis not present

## 2016-05-13 MED ORDER — CYANOCOBALAMIN 1000 MCG/ML IJ SOLN
1000.0000 ug | Freq: Once | INTRAMUSCULAR | Status: AC
  Administered 2016-05-13: 1000 ug via INTRAMUSCULAR

## 2016-05-13 MED ORDER — FLUCONAZOLE 150 MG PO TABS: 150.0000 mg | ORAL_TABLET | Freq: Every day | ORAL | 1 refills | Status: AC

## 2016-05-13 NOTE — Telephone Encounter (Signed)
Fluconazole refilled for 2 days

## 2016-05-13 NOTE — Progress Notes (Addendum)
Patient comes in for B 12 injection.  Injected left  deltoid.  Patient tolerated injection well.    I have reviewed the above information and agree with above.   Teresa Tullo, MD  

## 2016-05-13 NOTE — Telephone Encounter (Signed)
Patient states she is having vaginal itching with white discharge for the past 3-4 days.  She has not tired any OTC medication.  Just finished menstrual cycle.   Looks like diflucan prescribed to her 02/23/16.    Please advise.

## 2016-05-13 NOTE — Telephone Encounter (Signed)
Patient advised and verbalized and verbalized an understanding

## 2016-06-16 ENCOUNTER — Ambulatory Visit (INDEPENDENT_AMBULATORY_CARE_PROVIDER_SITE_OTHER): Payer: BLUE CROSS/BLUE SHIELD | Admitting: *Deleted

## 2016-06-16 DIAGNOSIS — E538 Deficiency of other specified B group vitamins: Secondary | ICD-10-CM | POA: Diagnosis not present

## 2016-06-16 MED ORDER — CYANOCOBALAMIN 1000 MCG/ML IJ SOLN
1000.0000 ug | Freq: Once | INTRAMUSCULAR | Status: AC
  Administered 2016-06-16: 1000 ug via INTRAMUSCULAR

## 2016-06-16 NOTE — Progress Notes (Signed)
Patient presented for monthly B 12 injection to right deltoid. Patient tolerated well

## 2016-06-20 NOTE — Progress Notes (Signed)
  I have reviewed the above information and agree with above.   Deva Ron, MD 

## 2016-07-20 ENCOUNTER — Ambulatory Visit (INDEPENDENT_AMBULATORY_CARE_PROVIDER_SITE_OTHER): Payer: BLUE CROSS/BLUE SHIELD

## 2016-07-20 DIAGNOSIS — E538 Deficiency of other specified B group vitamins: Secondary | ICD-10-CM

## 2016-07-20 MED ORDER — CYANOCOBALAMIN 1000 MCG/ML IJ SOLN
1000.0000 ug | Freq: Once | INTRAMUSCULAR | Status: AC
  Administered 2016-07-20: 1000 ug via INTRAMUSCULAR

## 2016-07-20 NOTE — Progress Notes (Addendum)
Patient comes in for B 12 injection.  Injected left deltoid.  Patient tolerated injection well.   Reviewed.  Dr Scott 

## 2016-08-24 ENCOUNTER — Ambulatory Visit: Payer: Self-pay

## 2016-08-26 ENCOUNTER — Ambulatory Visit (INDEPENDENT_AMBULATORY_CARE_PROVIDER_SITE_OTHER): Payer: BLUE CROSS/BLUE SHIELD | Admitting: *Deleted

## 2016-08-26 DIAGNOSIS — E538 Deficiency of other specified B group vitamins: Secondary | ICD-10-CM | POA: Diagnosis not present

## 2016-08-26 MED ORDER — CYANOCOBALAMIN 1000 MCG/ML IJ SOLN
1000.0000 ug | Freq: Once | INTRAMUSCULAR | Status: AC
  Administered 2016-08-26: 1000 ug via INTRAMUSCULAR

## 2016-08-26 NOTE — Progress Notes (Addendum)
Patient arrived for B 12 injection to right deltoid , patient voiced no concerns or showed no signs of distress during injection.  Reviewed.  Dr Nicki Reaper

## 2016-09-03 ENCOUNTER — Encounter: Payer: Self-pay | Admitting: Internal Medicine

## 2016-09-03 ENCOUNTER — Ambulatory Visit (INDEPENDENT_AMBULATORY_CARE_PROVIDER_SITE_OTHER): Payer: BLUE CROSS/BLUE SHIELD | Admitting: Internal Medicine

## 2016-09-03 VITALS — BP 98/68 | HR 77 | Temp 98.2°F | Resp 15 | Ht 68.0 in | Wt 196.6 lb

## 2016-09-03 DIAGNOSIS — R5382 Chronic fatigue, unspecified: Secondary | ICD-10-CM | POA: Diagnosis not present

## 2016-09-03 DIAGNOSIS — M545 Low back pain, unspecified: Secondary | ICD-10-CM

## 2016-09-03 DIAGNOSIS — Z79899 Other long term (current) drug therapy: Secondary | ICD-10-CM

## 2016-09-03 DIAGNOSIS — E663 Overweight: Secondary | ICD-10-CM

## 2016-09-03 MED ORDER — LIRAGLUTIDE -WEIGHT MANAGEMENT 18 MG/3ML ~~LOC~~ SOPN: 0.6000 mg | PEN_INJECTOR | Freq: Every day | SUBCUTANEOUS | 0 refills | Status: DC

## 2016-09-03 MED ORDER — PEN NEEDLES 31G X 6 MM MISC: 5 refills | Status: DC

## 2016-09-03 NOTE — Patient Instructions (Addendum)
Your  Recent labs including thyroid are all normal.  Nothing to suggest a cause for your persistent  fatigue.  Fatigue is much more common in the winter months for various reasons including diet,  decreased activity,  And sometimes can be due to decreased sunlight leading to seasonal affective disorder , which causes depression .  I agree with starting an exercise program to improve your conditioinging and help you lose weight.  If you don't feel an improvement in 3 months,  We should rule out sleep apnea with a sleep study  I am recommending use of the medication called Saxenda to help you lose weight.  It is similar to a a medicine that is used to treat diabetes called Victoza,  So It may lower your blood sugars .   It is injected daily in incrementally increasing doses (if tolerated,  Nausea usually resolves in a few days)"  0.6 mg daily   Week 1 1.2 mg daily Week 2 1.8 mg  Daly Week 3 2.4 mg daily Week 4 3.0 mg daily Week 5 and ongoing    Please return  After 2-3 weeks for non fasting labs.  And in 3 months to see me     To make a low carb chip :  Take the Joseph's Lavash or Pita bread,  Or the [place] Low carb whole wheat tortilla   Place on metal cookie sheet  Brush with olive oil  Sprinkle garlic powder (NOT garlic salt), grated parmesan cheese, mediterranean seasoning , or all of them?  Bake at 275 for 30 minutes   We have substitutions for your potatoes!!  Try the mashed cauliflower and riced cauliflower dishes instead of rice and mashed potatoes  Mashed turnips are also very low carb!   For desserts :  Try the Dannon Lt n Fit greek yogurt dessert flavors and top with reddi Whip .  8 carbs,  80 calories  Try Oikos Triple Zero [place] Yogurt in the salted caramel, and the coffee flavors  With Whipped Cream for dessert  breyer's low carb ice cream, available in bars (on a stick, better ) or scoopable ice cream  HERE ARE THE LOW CARB  BREAD CHOICES

## 2016-09-03 NOTE — Progress Notes (Signed)
Subjective:  Patient ID: Alexis Little, female    DOB: [birth year]  Age: 41 y.o. MRN: [id number]  CC: The primary encounter diagnosis was Bilateral low back pain without sciatica, unspecified chronicity. Diagnoses of Long-term use of high-risk medication, Chronic fatigue, and Overweight (BMI 25.0-29.9) were also pertinent to this visit.  HPI SHEFALI NG presents for follow up on persistent fatigue.  She has a History of chronic fatigue ,  Labs were normal in October , and she has been  receiving b12 injections for  B12 level at the  low end of normal.  States that initially she Used to get a " boost." of energy from the B12  injections but not lately,.   Sleeping fine.  Feels exhausted when she wakes up. The fatigue is worse during the week,  Feels better when she gets more hours of sleep (12 hours per night ) on the weekends .  Requesting  an rx for stand up desk  To use at work to reduce her low back pain from sitting all day      Lab Results  Component Value Date   VITAMINB12 269 12/18/2015     Outpatient Medications Prior to Visit  Medication Sig Dispense Refill  . ALPRAZolam (XANAX) 0.25 MG tablet Take by mouth. Reported on 11/19/2015    . Multiple Vitamin (MULTIVITAMIN) tablet Take 1 tablet by mouth daily.    . phentermine 37.5 MG capsule Take 1 capsule (37.5 mg total) by mouth every morning. (Patient not taking: Reported on 09/03/2016) 30 capsule 2   No facility-administered medications prior to visit.     Review of Systems;  Patient denies headache, fevers, malaise, unintentional weight loss, skin rash, eye pain, sinus congestion and sinus pain, sore throat, dysphagia,  hemoptysis , cough, dyspnea, wheezing, chest pain, palpitations, orthopnea, edema, abdominal pain, nausea, melena, diarrhea, constipation, flank pain, dysuria, hematuria, urinary  Frequency, nocturia, numbness, tingling, seizures,  Focal weakness, Loss of consciousness,  Tremor, insomnia, depression, anxiety,  and suicidal ideation.      Objective:  BP 98/68   Pulse 77   Temp 98.2 F (36.8 C) (Oral)   Resp 15   Ht 5\' 8"  (1.727 m)   Wt 196 lb 9.6 oz (89.2 kg)   SpO2 97%   BMI 29.89 kg/m   BP Readings from Last 3 Encounters:  09/03/16 98/68  02/23/16 92/70  12/18/15 92/64    Wt Readings from Last 3 Encounters:  09/03/16 196 lb 9.6 oz (89.2 kg)  02/23/16 188 lb 6.4 oz (85.5 kg)  12/18/15 187 lb (84.8 kg)    General appearance: alert, cooperative and appears stated age Ears: normal TM's and external ear canals both ears Throat: lips, mucosa, and tongue normal; teeth and gums normal Neck: no adenopathy, no carotid bruit, supple, symmetrical, trachea midline and thyroid not enlarged, symmetric, no tenderness/mass/nodules Back: symmetric, no curvature. ROM normal. No CVA tenderness. Lungs: clear to auscultation bilaterally Heart: regular rate and rhythm, S1, S2 normal, no murmur, click, rub or gallop Abdomen: soft, non-tender; bowel sounds normal; no masses,  no organomegaly Pulses: 2+ and symmetric Skin: Skin color, texture, turgor normal. No rashes or lesions Lymph nodes: Cervical, supraclavicular, and axillary nodes normal.  No results found for: HGBA1C  Lab Results  Component Value Date   CREATININE 0.81 12/16/2015   CREATININE 0.74 01/17/2015   CREATININE 0.70 01/13/2015    Lab Results  Component Value Date   WBC 5.2 12/18/2015   HGB 14.8 12/18/2015  HCT 44.0 12/18/2015   PLT 267.0 12/18/2015   GLUCOSE 79 12/16/2015   CHOL 166 12/16/2015   TRIG 56.0 12/16/2015   HDL 48.90 12/16/2015   LDLCALC 106 (H) 12/16/2015   ALT 10 12/16/2015   AST 13 12/16/2015   NA 140 12/16/2015   K 4.5 12/16/2015   CL 107 12/16/2015   CREATININE 0.81 12/16/2015   BUN 11 12/16/2015   CO2 29 12/16/2015   TSH 1.10 12/16/2015   INR 1.1 (H) 09/17/2015   MICROALBUR <0.7 12/16/2015    No results found.  Assessment & Plan:   Problem List Items Addressed This Visit    Chronic  fatigue    Screening labs normal.  There is a history of snoring.  Recommended participating in regular exercise program with goal of achieving a minimum of 30 minutes of aerobic activity 5 days per week.  If still tired in 3 months,  Sleep study advised.   Lab Results  Component Value Date   TSH 1.10 12/16/2015   Lab Results  Component Value Date   WBC 5.2 12/18/2015   HGB 14.8 12/18/2015   HCT 44.0 12/18/2015   MCV 88.3 12/18/2015   PLT 267.0 12/18/2015   .      Overweight (BMI 25.0-29.9)    I have addressed  BMI and recommended wt loss of 10% of body weight over the next 6 months using a low fat, low starch, high protein  fruit/vegetable based Mediterranean diet and 30 minutes of aerobic exercise a minimum of 5 days per week. I am recommending use of the medication called Saxenda to help her  lose weight.   0.6 mg daily   Week 1 1.2 mg daily Week 2 1.8 mg  Daly Week 3 2.4 mg daily Week 4 3.0 mg daily Week 5 and ongoing         Other Visit Diagnoses    Bilateral low back pain without sciatica, unspecified chronicity    -  Primary   Relevant Orders   DME Other see comment   Long-term use of high-risk medication       Relevant Orders   Comprehensive metabolic panel     A total of 25 minutes of face to face time was spent with patient more than half of which was spent in counselling about the above mentioned conditions  and coordination of care   I have discontinued Ms. Adah Salvage ALPRAZolam, phentermine, and multivitamin. I am also having her start on Liraglutide -Weight Management and Pen Needles.  Meds ordered this encounter  Medications  . Liraglutide -Weight Management (SAXENDA) 18 MG/3ML SOPN    Sig: Inject 0.6 mg into the skin daily. Increase dose weekly as follows: Week 2: 1.2 mg daily ; Week 3: 1.8 mg daily; Week 4: 2.4 mg daily    Dispense:  9 mL    Refill:  0  . Insulin Pen Needle (PEN NEEDLES) 31G X 6 MM MISC    Sig: For use with victoza /saxenda     Dispense:  30 each    Refill:  5    Medications Discontinued During This Encounter  Medication Reason  . ALPRAZolam (XANAX) 0.25 MG tablet Patient has not taken in last 30 days  . Multiple Vitamin (MULTIVITAMIN) tablet Patient has not taken in last 30 days  . phentermine 37.5 MG capsule Patient has not taken in last 30 days    Follow-up: Return in about 3 months (around 12/03/2016).   Crecencio Mc, MD

## 2016-09-03 NOTE — Progress Notes (Signed)
Pre visit review using our clinic review tool, if applicable. No additional management support is needed unless otherwise documented below in the visit note. 

## 2016-09-05 ENCOUNTER — Encounter: Payer: Self-pay | Admitting: Internal Medicine

## 2016-09-05 NOTE — Assessment & Plan Note (Signed)
Screening labs normal.  There is a history of snoring.  Recommended participating in regular exercise program with goal of achieving a minimum of 30 minutes of aerobic activity 5 days per week.  If still tired in 3 months,  Sleep study advised.   Lab Results  Component Value Date   TSH 1.10 12/16/2015   Lab Results  Component Value Date   WBC 5.2 12/18/2015   HGB 14.8 12/18/2015   HCT 44.0 12/18/2015   MCV 88.3 12/18/2015   PLT 267.0 12/18/2015   .

## 2016-09-05 NOTE — Assessment & Plan Note (Signed)
I have addressed  BMI and recommended wt loss of 10% of body weight over the next 6 months using a low fat, low starch, high protein  fruit/vegetable based Mediterranean diet and 30 minutes of aerobic exercise a minimum of 5 days per week. I am recommending use of the medication called Saxenda to help her  lose weight.   0.6 mg daily   Week 1 1.2 mg daily Week 2 1.8 mg  Daly Week 3 2.4 mg daily Week 4 3.0 mg daily Week 5 and ongoing

## 2016-09-07 ENCOUNTER — Telehealth: Payer: Self-pay

## 2016-09-07 NOTE — Telephone Encounter (Signed)
Ms Ronnald Ramp,   I was unable to get Saxenda approved for  Your weight loss goals.  Your insurance requires that you "fail" treatment with Belviq and Qysmia first, I have enclosed a brief description of these medications to help you consider a covered alternative .   1) Saxenda (not covered)  is imilar to a a medicine that is used to lower blood sugars in patient with diabetes called Victoza. It is injected daily under the skin using a pen with a tiny needle.  The dose is incrementally increased over 5 weeks  (as tolerated, because it initially may cause  nausea which should resolve after the first day or two.  It is the approved for long ter use .   2) the second two are called Belviq and Qsymia. Both medications are oral; they are stimulants. qsymia is a combination of phentermine and topomax. Topomax is a medication that was marketed to prevent migraines, But was found to suppress appetite as well.  So if you have ever used phentermine In the past and did not tolerate it, I would certainly not try it again. Belviq is less of a stimulant than phentermine (newer in fact) but still a very good appetite suppressant and better tolerated but more expensive.  3) The final choice(not covered) , Contrave,  Is a combination of the antidepressant Wellbutrin , and a drug called naltrexone, which is used to treat addictive behaviors.  If you would like to consider any of these medications,  Let me know.  They all require commitment to regular exercise,  A one week nurse visit to check your blood pressure and pulse,  And a  three month follow up with me to monitor your progress.    Regards,   Deborra Medina, MD

## 2016-09-07 NOTE — Telephone Encounter (Signed)
Received a fax from [facility] stating that the prescription for saxenda has been denied. It states that the pt will need to try and fail both Belviq and qsymia.

## 2016-09-08 NOTE — Telephone Encounter (Signed)
Printed and put up front to be mailed to pt.

## 2016-09-13 MED ORDER — PHENTERMINE-TOPIRAMATE ER 3.75-23 MG PO CP24: ORAL_CAPSULE | ORAL | 0 refills | Status: DC

## 2016-09-13 NOTE — Telephone Encounter (Signed)
Please advise 

## 2016-09-13 NOTE — Addendum Note (Signed)
Addended by: Crecencio Mc on: 09/13/2016 06:03 PM   Modules accepted: Orders

## 2016-09-13 NOTE — Telephone Encounter (Signed)
Good morning!  Pt would like Qsymia medication to try. Thank you!  Call pt @ [phone].

## 2016-09-13 NOTE — Telephone Encounter (Signed)
The starting dose of Qsymia is 3.75/23  one tablet daily  For 2 weeks,  Then increase to 2 tablets daily after 2 weeks   Needs to have vital signs checked during first month (blood pressure and pulse) before increasing the dose and a starting weight .   I will refill for 3 months after that.  She needs to make an appointment in  4 months to determine If medication is achieving goal  Weight loss Of 3% of starting weight

## 2016-09-13 NOTE — Telephone Encounter (Addendum)
A voicemail was left for pt to call back. Please call pt on 09/14/16 to schedule 4 month F/U on [facility]

## 2016-09-14 NOTE — Telephone Encounter (Signed)
Patient has been scheduled on November 10 2016

## 2016-09-14 NOTE — Telephone Encounter (Signed)
Called and left msg to schedule and appt.

## 2016-09-17 NOTE — Telephone Encounter (Signed)
Patient has requested update on the weight loss medication PA  Pt contact [phone]

## 2016-09-17 NOTE — Telephone Encounter (Signed)
Spoke with pt to let hr know that we are waiting on the PA approval or denial and that it could take anywhere from 3-5 business days. Pt gave a verbal understanding.

## 2016-09-21 ENCOUNTER — Telehealth: Payer: Self-pay

## 2016-09-21 NOTE — Telephone Encounter (Signed)
Received a fax from insurance stating that they have approved the qsymia. Both the pt and the pharmacy has been notified of this.

## 2016-09-29 ENCOUNTER — Ambulatory Visit (INDEPENDENT_AMBULATORY_CARE_PROVIDER_SITE_OTHER): Payer: BLUE CROSS/BLUE SHIELD | Admitting: *Deleted

## 2016-09-29 DIAGNOSIS — E538 Deficiency of other specified B group vitamins: Secondary | ICD-10-CM | POA: Diagnosis not present

## 2016-09-29 MED ORDER — CYANOCOBALAMIN 1000 MCG/ML IJ SOLN
1000.0000 ug | Freq: Once | INTRAMUSCULAR | Status: AC
  Administered 2016-09-29: 1000 ug via INTRAMUSCULAR

## 2016-09-29 NOTE — Progress Notes (Signed)
Patient presented for B 12 injection to left deltoid, patient voiced no concerns or no signs of discomfort during injection.

## 2016-10-03 NOTE — Progress Notes (Signed)
  I have reviewed the above information and agree with above.   Emalia Witkop, MD 

## 2016-10-11 ENCOUNTER — Telehealth: Payer: Self-pay | Admitting: Internal Medicine

## 2016-10-11 MED ORDER — PHENTERMINE-TOPIRAMATE ER 7.5-46 MG PO CP24: 1.0000 | ORAL_CAPSULE | Freq: Every day | ORAL | 2 refills | Status: DC

## 2016-10-11 NOTE — Telephone Encounter (Signed)
Pt called and is requesting a refill on her Phentermine-Topiramate (QSYMIA) 3.75-23 MG CP24. Pt states that she believes that it should also be a dosage increase to 7 mg. Please advise, thank you!  [place], Leland A  Call pt @ [phone]

## 2016-10-11 NOTE — Telephone Encounter (Signed)
INCREASED DOSE,  INE TALBET DAILY,  SENT,  NEEDS FOLLOW UP APPT MADE FOR MID July

## 2016-10-11 NOTE — Telephone Encounter (Signed)
Please advise 

## 2016-10-12 NOTE — Telephone Encounter (Signed)
Spoke with pt and informed her that the medication was sent with the increased dose. Also scheduled the pt a follow up with Dr. Derrel Nip in mid July.

## 2016-11-03 ENCOUNTER — Ambulatory Visit (INDEPENDENT_AMBULATORY_CARE_PROVIDER_SITE_OTHER): Payer: BLUE CROSS/BLUE SHIELD

## 2016-11-03 ENCOUNTER — Ambulatory Visit: Payer: Self-pay

## 2016-11-03 DIAGNOSIS — R5382 Chronic fatigue, unspecified: Secondary | ICD-10-CM

## 2016-11-03 MED ORDER — CYANOCOBALAMIN 1000 MCG/ML IJ SOLN
1000.0000 ug | Freq: Once | INTRAMUSCULAR | Status: AC
  Administered 2016-11-03: 1000 ug via INTRAMUSCULAR

## 2016-11-03 NOTE — Progress Notes (Addendum)
Patient came in for B12 injection. Received in Left deltoid.  Patient tolerated well.    Patient had some additional concerns today.  She realized yesterday that it has been approximately 6 weeks since her last menstrual cycle.  She is approximately two weeks late, her husband had a vasectomy years ago, she did take a pregnancy test which was negative, no feelings like she was pregnant, but should she be concerned? She has never gone without her cycle except when she had a IUD in the past.  She is taking the Qsymia, could that cause it? Or could she be pre menopausal? She also asked if a OTC allergy pill was okay to take with her current medications.  Conferred with Dr.Cook and he agreed it would be okay.  Thanks  Please advise  It is too early to jump to a conclusion about menopause, it is common to develop some irregularity  In the years before menopause.  Stress (physcal and emotional) can both cause periods to be late,  So I would just give it some time.    I have reviewed the above information and agree with above with the additional comments .   Deborra Medina, MD

## 2016-11-10 ENCOUNTER — Ambulatory Visit: Payer: Self-pay | Admitting: Internal Medicine

## 2016-11-12 ENCOUNTER — Telehealth: Payer: Self-pay | Admitting: Internal Medicine

## 2016-11-12 DIAGNOSIS — Z1239 Encounter for other screening for malignant neoplasm of breast: Secondary | ICD-10-CM

## 2016-11-12 NOTE — Telephone Encounter (Signed)
Order has been placed and pt has been scheduled for June 29th at 9:50am at [facility]. Pt is awareof appt date and time.

## 2016-11-12 NOTE — Telephone Encounter (Signed)
Pt called to follow up with her mammo. Order is needed please and thank you! Pt would like to go to [facility].   Call pt @ [phone]. Thank you!

## 2016-11-19 DIAGNOSIS — Z1231 Encounter for screening mammogram for malignant neoplasm of breast: Secondary | ICD-10-CM | POA: Diagnosis not present

## 2016-12-02 ENCOUNTER — Telehealth: Payer: Self-pay | Admitting: Internal Medicine

## 2016-12-02 NOTE — Telephone Encounter (Signed)
Pt called dn wanted to know if we could call in diflucan in for her. She has developed a yeast infection about 2 days ago. Please advise, thank you!  [place], Ripley A  Call pt @ [phone]

## 2016-12-02 NOTE — Telephone Encounter (Signed)
Please advise 

## 2016-12-06 DIAGNOSIS — R928 Other abnormal and inconclusive findings on diagnostic imaging of breast: Secondary | ICD-10-CM | POA: Diagnosis not present

## 2016-12-06 LAB — HM MAMMOGRAPHY

## 2016-12-07 ENCOUNTER — Ambulatory Visit: Payer: Self-pay

## 2016-12-08 ENCOUNTER — Encounter: Payer: Self-pay | Admitting: Internal Medicine

## 2016-12-08 ENCOUNTER — Ambulatory Visit (INDEPENDENT_AMBULATORY_CARE_PROVIDER_SITE_OTHER): Payer: BLUE CROSS/BLUE SHIELD | Admitting: Internal Medicine

## 2016-12-08 VITALS — BP 102/58 | HR 76 | Temp 98.2°F | Resp 16 | Ht 68.0 in | Wt 195.6 lb

## 2016-12-08 DIAGNOSIS — E538 Deficiency of other specified B group vitamins: Secondary | ICD-10-CM

## 2016-12-08 DIAGNOSIS — E663 Overweight: Secondary | ICD-10-CM

## 2016-12-08 MED ORDER — CYANOCOBALAMIN 1000 MCG/ML IJ SOLN
1000.0000 ug | Freq: Once | INTRAMUSCULAR | Status: AC
  Administered 2016-12-08: 1000 ug via INTRAMUSCULAR

## 2016-12-08 NOTE — Patient Instructions (Addendum)
Try Almased for the diet plan  yu can also try drinking a glass of water with citrucel in it 30 minutes prior to a meal (to fill up)  30 minutes of cardio 5 days /week = goal  See you in 6 months

## 2016-12-08 NOTE — Progress Notes (Signed)
Subjective:  Patient ID: Alexis Little, female    DOB: [birth year]  Age: 41 y.o. MRN: [id number]  CC: The primary encounter diagnosis was Vitamin B12 deficiency. A diagnosis of Overweight (BMI 25.0-29.9) was also pertinent to this visit.  HPI Alexis Little presents for 3 month follow up on obesity and fatigue .    She has had a difficult time losing weight without pharmacotherapy.  saxenda not covered, so she has been taking qsymia , but is not tolerating qsymia due to irritability,  So she has Stopped it.   She has lost 1 lb since April and her BMI is 29.8.   Current dietary plan and exercise program discussed in detail.   Has been taking zinc supplements  For overall health and recurrent URI's.    Outpatient Medications Prior to Visit  Medication Sig Dispense Refill  . Insulin Pen Needle (PEN NEEDLES) 31G X 6 MM MISC For use with victoza /saxenda (Patient not taking: Reported on 12/08/2016) 30 each 5  . Liraglutide -Weight Management (SAXENDA) 18 MG/3ML SOPN Inject 0.6 mg into the skin daily. Increase dose weekly as follows: Week 2: 1.2 mg daily ; Week 3: 1.8 mg daily; Week 4: 2.4 mg daily (Patient not taking: Reported on 12/08/2016) 9 mL 0  . Phentermine-Topiramate (QSYMIA) 7.5-46 MG CP24 Take 1 tablet by mouth daily. (Patient not taking: Reported on 12/08/2016) 30 capsule 2   No facility-administered medications prior to visit.     Review of Systems;  Patient denies headache, fevers, malaise, unintentional weight loss, skin rash, eye pain, sinus congestion and sinus pain, sore throat, dysphagia,  hemoptysis , cough, dyspnea, wheezing, chest pain, palpitations, orthopnea, edema, abdominal pain, nausea, melena, diarrhea, constipation, flank pain, dysuria, hematuria, urinary  Frequency, nocturia, numbness, tingling, seizures,  Focal weakness, Loss of consciousness,  Tremor, insomnia, depression, anxiety, and suicidal ideation.      Objective:  BP (!) 102/58 (BP Location: Left Arm,  Patient Position: Sitting, Cuff Size: Large)   Pulse 76   Temp 98.2 F (36.8 C) (Oral)   Resp 16   Ht 5\' 8"  (1.727 m)   Wt 195 lb 9.6 oz (88.7 kg)   SpO2 98%   BMI 29.74 kg/m   BP Readings from Last 3 Encounters:  12/08/16 (!) 102/58  09/03/16 98/68  02/23/16 92/70    Wt Readings from Last 3 Encounters:  12/08/16 195 lb 9.6 oz (88.7 kg)  09/03/16 196 lb 9.6 oz (89.2 kg)  02/23/16 188 lb 6.4 oz (85.5 kg)    General appearance: alert, cooperative and appears stated age Ears: normal TM's and external ear canals both ears Throat: lips, mucosa, and tongue normal; teeth and gums normal Neck: no adenopathy, no carotid bruit, supple, symmetrical, trachea midline and thyroid not enlarged, symmetric, no tenderness/mass/nodules Back: symmetric, no curvature. ROM normal. No CVA tenderness. Lungs: clear to auscultation bilaterally Heart: regular rate and rhythm, S1, S2 normal, no murmur, click, rub or gallop Abdomen: soft, non-tender; bowel sounds normal; no masses,  no organomegaly Pulses: 2+ and symmetric Skin: Skin color, texture, turgor normal. No rashes or lesions Lymph nodes: Cervical, supraclavicular, and axillary nodes normal.  No results found for: HGBA1C  Lab Results  Component Value Date   CREATININE 0.81 12/16/2015   CREATININE 0.74 01/17/2015   CREATININE 0.70 01/13/2015    Lab Results  Component Value Date   WBC 5.2 12/18/2015   HGB 14.8 12/18/2015   HCT 44.0 12/18/2015   PLT 267.0 12/18/2015  GLUCOSE 79 12/16/2015   CHOL 166 12/16/2015   TRIG 56.0 12/16/2015   HDL 48.90 12/16/2015   LDLCALC 106 (H) 12/16/2015   ALT 10 12/16/2015   AST 13 12/16/2015   NA 140 12/16/2015   K 4.5 12/16/2015   CL 107 12/16/2015   CREATININE 0.81 12/16/2015   BUN 11 12/16/2015   CO2 29 12/16/2015   TSH 1.10 12/16/2015   INR 1.1 (H) 09/17/2015   MICROALBUR <0.7 12/16/2015    No results found.  Assessment & Plan:   Problem List Items Addressed This Visit     Overweight (BMI 25.0-29.9)    She did not tolelrate trial of Qsymia due to irritability,.  Has lost 1 lbs wince Aprl.  I have addressed  BMI and recommended wt loss of 10% of body weight over the next 6 months using a low glycemic index diet and regular exercise a minimum of 5 days per week.  Almased and use of BFL prior to biggest meal recommended as well         Other Visit Diagnoses    Vitamin B12 deficiency    -  Primary   Relevant Medications   cyanocobalamin ((VITAMIN B-12)) injection 1,000 mcg (Completed)      I have discontinued Alexis Little Liraglutide -Weight Management, Pen Needles, and Phentermine-Topiramate. We administered cyanocobalamin.  Meds ordered this encounter  Medications  . cyanocobalamin ((VITAMIN B-12)) injection 1,000 mcg    Medications Discontinued During This Encounter  Medication Reason  . Phentermine-Topiramate (QSYMIA) 7.5-46 MG CP24   . Liraglutide -Weight Management (SAXENDA) 18 MG/3ML SOPN   . Insulin Pen Needle (PEN NEEDLES) 31G X 6 MM MISC     Follow-up: Return in about 6 months (around 06/10/2017).   Crecencio Mc, MD

## 2016-12-11 NOTE — Assessment & Plan Note (Signed)
She did not tolelrate trial of Qsymia due to irritability,.  Has lost 1 lbs wince Aprl.  I have addressed  BMI and recommended wt loss of 10% of body weight over the next 6 months using a low glycemic index diet and regular exercise a minimum of 5 days per week.  Almased and use of BFL prior to biggest meal recommended as well

## 2017-01-11 ENCOUNTER — Ambulatory Visit (INDEPENDENT_AMBULATORY_CARE_PROVIDER_SITE_OTHER): Payer: BLUE CROSS/BLUE SHIELD | Admitting: *Deleted

## 2017-01-11 DIAGNOSIS — E538 Deficiency of other specified B group vitamins: Secondary | ICD-10-CM

## 2017-01-11 MED ORDER — CYANOCOBALAMIN 1000 MCG/ML IJ SOLN
1000.0000 ug | Freq: Once | INTRAMUSCULAR | Status: AC
  Administered 2017-01-11: 1000 ug via INTRAMUSCULAR

## 2017-01-11 NOTE — Progress Notes (Addendum)
Patient presented for B 12 injection to left deltoid, patient voiced no concerns nor showed any signs of distress during injection  Patient did state that she feels the B 12 is not helping with her energy level as it first did when she was taking the B 12, last level was done 11/2015 patient ask could a level be checked?  Agree with plan.  Sent note to Tullo and her cma regarding reaching out to patient regarding labs, fatigue. Margaret arnett, np

## 2017-01-18 ENCOUNTER — Telehealth: Payer: Self-pay | Admitting: Internal Medicine

## 2017-01-18 NOTE — Telephone Encounter (Signed)
Pt called returning your call. Thank you! °

## 2017-01-18 NOTE — Telephone Encounter (Signed)
LMTCB

## 2017-01-19 NOTE — Telephone Encounter (Signed)
It is not necessary,if she just had a B12 injection within the past 2 weeks  it will be up

## 2017-01-19 NOTE — Telephone Encounter (Signed)
Spoke with pt and she stated that she was in the other day for a nurse visit to get her b12 injection. Pt stated that she has not felt any change in her fatigue since she started the b12 injections. Pt stated that she has not had her b12 level checked in quite some time and was wondering if it would be ok to check it and see if the injections are bringing her levels up.

## 2017-01-20 NOTE — Telephone Encounter (Signed)
LMTCB

## 2017-02-15 ENCOUNTER — Telehealth: Payer: Self-pay | Admitting: Internal Medicine

## 2017-02-15 ENCOUNTER — Ambulatory Visit (INDEPENDENT_AMBULATORY_CARE_PROVIDER_SITE_OTHER): Payer: BLUE CROSS/BLUE SHIELD | Admitting: *Deleted

## 2017-02-15 DIAGNOSIS — E538 Deficiency of other specified B group vitamins: Secondary | ICD-10-CM

## 2017-02-15 MED ORDER — CYANOCOBALAMIN 1000 MCG/ML IJ SOLN
1000.0000 ug | Freq: Once | INTRAMUSCULAR | Status: AC
  Administered 2017-02-15: 1000 ug via INTRAMUSCULAR

## 2017-02-15 NOTE — Telephone Encounter (Signed)
See previous message

## 2017-02-15 NOTE — Telephone Encounter (Signed)
Spoke with pt and informed her that Dr. Derrel Nip is okay with her trying the b12 injections every two weeks to see if it helps. Scheduled pt her next two b12 injections. Pt is aware of appt date and time.

## 2017-02-15 NOTE — Telephone Encounter (Signed)
Pt came in for her b12 shot this morning and stated that you have been playing phone tag and to call her at [phone]

## 2017-02-15 NOTE — Telephone Encounter (Signed)
LMTCB. Need to let pt know that Dr. Derrel Nip is okay with her doing to B12 injections very two weeks and get her to schedule a nurse 2 weeks from today. Last b12 injection was today, 02/15/2017.

## 2017-02-15 NOTE — Telephone Encounter (Signed)
Spoke with Alexis Little and informed her that if she just had her b12 injection within the last two weeks then there is no need to do the b12 lab draw because her levels are going to be up. The Alexis Little gave a verbal understanding. Alexis Little was wondering though if she would get better results if she were to get the b12 injections every two weeks. The Alexis Little stated that by the 3rd week she is dragging to the point she can barely do anything. Would Alexis Little be able to try the b12 injections biweekly and see how that works for her?

## 2017-02-15 NOTE — Progress Notes (Addendum)
Patient presented for B 12 injection to right deltoid, patient voiced no concerns nor showed any signs of distress during injection.   I have reviewed the above information and agree with above.   Teresa Tullo, MD  

## 2017-02-15 NOTE — Telephone Encounter (Signed)
Yes

## 2017-03-01 ENCOUNTER — Ambulatory Visit (INDEPENDENT_AMBULATORY_CARE_PROVIDER_SITE_OTHER): Payer: BLUE CROSS/BLUE SHIELD

## 2017-03-01 DIAGNOSIS — E538 Deficiency of other specified B group vitamins: Secondary | ICD-10-CM | POA: Diagnosis not present

## 2017-03-01 MED ORDER — CYANOCOBALAMIN 1000 MCG/ML IJ SOLN
1000.0000 ug | Freq: Once | INTRAMUSCULAR | Status: AC
  Administered 2017-03-01: 1000 ug via INTRAMUSCULAR

## 2017-03-01 NOTE — Progress Notes (Signed)
Patient comes in for B 12 injection.  Injected left deltoid.  Patient tolerated injection well.   Reviewed.  Dr Scott 

## 2017-03-15 ENCOUNTER — Ambulatory Visit (INDEPENDENT_AMBULATORY_CARE_PROVIDER_SITE_OTHER): Payer: BLUE CROSS/BLUE SHIELD | Admitting: *Deleted

## 2017-03-15 DIAGNOSIS — E538 Deficiency of other specified B group vitamins: Secondary | ICD-10-CM | POA: Diagnosis not present

## 2017-03-15 MED ORDER — CYANOCOBALAMIN 1000 MCG/ML IJ SOLN
1000.0000 ug | Freq: Once | INTRAMUSCULAR | Status: AC
  Administered 2017-03-15: 1000 ug via INTRAMUSCULAR

## 2017-03-15 NOTE — Progress Notes (Signed)
Patient presented for B 12 injection to right deltoid, patient voiced no concerns nor showed any signs of distress during injection. 

## 2017-03-22 ENCOUNTER — Ambulatory Visit: Payer: Self-pay

## 2017-03-30 ENCOUNTER — Ambulatory Visit (INDEPENDENT_AMBULATORY_CARE_PROVIDER_SITE_OTHER): Payer: BLUE CROSS/BLUE SHIELD

## 2017-03-30 DIAGNOSIS — E538 Deficiency of other specified B group vitamins: Secondary | ICD-10-CM

## 2017-03-30 MED ORDER — CYANOCOBALAMIN 1000 MCG/ML IJ SOLN
1000.0000 ug | Freq: Once | INTRAMUSCULAR | Status: AC
  Administered 2017-03-30: 1000 ug via INTRAMUSCULAR

## 2017-03-30 NOTE — Progress Notes (Signed)
Patient comes in for B 12 injection.  Injected left deltoid.  Patient tolerated injection well.  

## 2017-04-19 ENCOUNTER — Ambulatory Visit (INDEPENDENT_AMBULATORY_CARE_PROVIDER_SITE_OTHER): Payer: BLUE CROSS/BLUE SHIELD | Admitting: *Deleted

## 2017-04-19 DIAGNOSIS — E538 Deficiency of other specified B group vitamins: Secondary | ICD-10-CM

## 2017-04-19 MED ORDER — CYANOCOBALAMIN 1000 MCG/ML IJ SOLN
1000.0000 ug | Freq: Once | INTRAMUSCULAR | Status: AC
  Administered 2017-04-19: 1000 ug via INTRAMUSCULAR

## 2017-04-19 NOTE — Progress Notes (Addendum)
Patient presented for B 12 injection to right deltoid, patient voiced no concerns nor showed any signs of distress during injection.  Per phone note form 02/18/17 ok to receive injection evry 2 weeks.  Reviewed.  Dr Nicki Reaper

## 2017-04-28 DIAGNOSIS — Z85828 Personal history of other malignant neoplasm of skin: Secondary | ICD-10-CM | POA: Diagnosis not present

## 2017-05-03 ENCOUNTER — Ambulatory Visit: Payer: Self-pay

## 2017-05-04 ENCOUNTER — Ambulatory Visit (INDEPENDENT_AMBULATORY_CARE_PROVIDER_SITE_OTHER): Payer: BLUE CROSS/BLUE SHIELD | Admitting: *Deleted

## 2017-05-04 DIAGNOSIS — E538 Deficiency of other specified B group vitamins: Secondary | ICD-10-CM | POA: Diagnosis not present

## 2017-05-04 MED ORDER — CYANOCOBALAMIN 1000 MCG/ML IJ SOLN
1000.0000 ug | Freq: Once | INTRAMUSCULAR | Status: AC
  Administered 2017-05-04: 1000 ug via INTRAMUSCULAR

## 2017-05-04 NOTE — Progress Notes (Signed)
Patient presented for B 12 injection to left deltoid, patient voiced no concerns nor showed any signs of distress during injection. 

## 2017-05-18 ENCOUNTER — Ambulatory Visit: Payer: Self-pay

## 2017-05-19 ENCOUNTER — Ambulatory Visit (INDEPENDENT_AMBULATORY_CARE_PROVIDER_SITE_OTHER): Payer: BLUE CROSS/BLUE SHIELD

## 2017-05-19 DIAGNOSIS — E538 Deficiency of other specified B group vitamins: Secondary | ICD-10-CM

## 2017-05-19 MED ORDER — CYANOCOBALAMIN 1000 MCG/ML IJ SOLN
1000.0000 ug | Freq: Once | INTRAMUSCULAR | Status: AC
  Administered 2017-05-19: 1000 ug via INTRAMUSCULAR

## 2017-05-19 NOTE — Progress Notes (Signed)
Patient comes in today for a Vitamin B 12 injection. Patient receives this biweekly per approval from PCP. Administered injection in right deltoid. Patient tolerated well.

## 2017-06-02 ENCOUNTER — Ambulatory Visit: Payer: Self-pay

## 2017-06-02 ENCOUNTER — Ambulatory Visit (INDEPENDENT_AMBULATORY_CARE_PROVIDER_SITE_OTHER): Payer: BLUE CROSS/BLUE SHIELD

## 2017-06-02 DIAGNOSIS — E539 Vitamin B deficiency, unspecified: Secondary | ICD-10-CM | POA: Diagnosis not present

## 2017-06-02 MED ORDER — CYANOCOBALAMIN 1000 MCG/ML IJ SOLN
1000.0000 ug | Freq: Once | INTRAMUSCULAR | Status: AC
  Administered 2017-06-02: 1000 ug via INTRAMUSCULAR

## 2017-06-02 NOTE — Progress Notes (Addendum)
Patient comes in today for a Vitamin B 12 injection. Patient receives this biweekly per approval from PCP. Administered in left deltoid. Patient tolerated well.   I have reviewed the above information and agree with above.   Deborra Medina, MD

## 2017-06-09 DIAGNOSIS — M9901 Segmental and somatic dysfunction of cervical region: Secondary | ICD-10-CM | POA: Diagnosis not present

## 2017-06-09 DIAGNOSIS — M9902 Segmental and somatic dysfunction of thoracic region: Secondary | ICD-10-CM | POA: Diagnosis not present

## 2017-06-09 DIAGNOSIS — M4604 Spinal enthesopathy, thoracic region: Secondary | ICD-10-CM | POA: Diagnosis not present

## 2017-06-09 DIAGNOSIS — M4602 Spinal enthesopathy, cervical region: Secondary | ICD-10-CM | POA: Diagnosis not present

## 2017-06-10 ENCOUNTER — Encounter: Payer: Self-pay | Admitting: Internal Medicine

## 2017-06-10 ENCOUNTER — Ambulatory Visit: Payer: BLUE CROSS/BLUE SHIELD | Admitting: Internal Medicine

## 2017-06-10 VITALS — BP 114/78 | HR 71 | Temp 98.2°F | Resp 15 | Ht 68.0 in | Wt 205.6 lb

## 2017-06-10 DIAGNOSIS — Z0000 Encounter for general adult medical examination without abnormal findings: Secondary | ICD-10-CM | POA: Diagnosis not present

## 2017-06-10 DIAGNOSIS — Z6831 Body mass index (BMI) 31.0-31.9, adult: Secondary | ICD-10-CM | POA: Diagnosis not present

## 2017-06-10 DIAGNOSIS — E6609 Other obesity due to excess calories: Secondary | ICD-10-CM

## 2017-06-10 DIAGNOSIS — E559 Vitamin D deficiency, unspecified: Secondary | ICD-10-CM | POA: Diagnosis not present

## 2017-06-10 DIAGNOSIS — N926 Irregular menstruation, unspecified: Secondary | ICD-10-CM

## 2017-06-10 DIAGNOSIS — M9902 Segmental and somatic dysfunction of thoracic region: Secondary | ICD-10-CM | POA: Diagnosis not present

## 2017-06-10 DIAGNOSIS — M9901 Segmental and somatic dysfunction of cervical region: Secondary | ICD-10-CM | POA: Diagnosis not present

## 2017-06-10 DIAGNOSIS — Z809 Family history of malignant neoplasm, unspecified: Secondary | ICD-10-CM

## 2017-06-10 DIAGNOSIS — M4602 Spinal enthesopathy, cervical region: Secondary | ICD-10-CM | POA: Diagnosis not present

## 2017-06-10 DIAGNOSIS — M4604 Spinal enthesopathy, thoracic region: Secondary | ICD-10-CM | POA: Diagnosis not present

## 2017-06-10 MED ORDER — PHENTERMINE HCL 37.5 MG PO TABS: 37.5000 mg | ORAL_TABLET | Freq: Every day | ORAL | 2 refills | Status: DC

## 2017-06-10 MED ORDER — BUSPIRONE HCL 7.5 MG PO TABS: 7.5000 mg | ORAL_TABLET | Freq: Three times a day (TID) | ORAL | 2 refills | Status: DC

## 2017-06-10 NOTE — Progress Notes (Signed)
Subjective:  Patient ID: Alexis Little, female    DOB: [birth year]  Age: 42 y.o. MRN: [id number]  CC: The primary encounter diagnosis was Routine general medical examination at a health care facility. Diagnoses of Vitamin D deficiency, Irregular menstrual cycle, Class 1 obesity due to excess calories without serious comorbidity with body mass index (BMI) of 31.0 to 31.9 in adult, and Paternal family history of cancer were also pertinent to this visit.  HPI Alexis Little presents for follow up on overwieght   She Has gained 10 lbs since lalst visit, largely due to family stressors that have distracted her from maintaining healthy eating and exercise habits   Now Body mass index is 31.26 kg/m.   Her step son has been hospitalized in the ICU at [facility] for pneumonia complicated by cystic fibrosis.  Patient is requesting genetic testing done.  She is requesting this because of the multiple cancers that have been diagnosed on her paternal side, including colon ca, lug ca , testicular cancer , esophageal , laryngeal,  And pancreatic CA.    No outpatient medications prior to visit.   No facility-administered medications prior to visit.     Review of Systems;  Patient denies headache, fevers, malaise, unintentional weight loss, skin rash, eye pain, sinus congestion and sinus pain, sore throat, dysphagia,  hemoptysis , cough, dyspnea, wheezing, chest pain, palpitations, orthopnea, edema, abdominal pain, nausea, melena, diarrhea, constipation, flank pain, dysuria, hematuria, urinary  Frequency, nocturia, numbness, tingling, seizures,  Focal weakness, Loss of consciousness,  Tremor, insomnia, depression, anxiety, and suicidal ideation.      Objective:  BP 114/78 (BP Location: Left Arm, Patient Position: Sitting, Cuff Size: Normal)   Pulse 71   Temp 98.2 F (36.8 C) (Oral)   Resp 15   Ht 5\' 8"  (1.727 m)   Wt 205 lb 9.6 oz (93.3 kg)   SpO2 98%   BMI 31.26 kg/m   BP Readings from Last 3  Encounters:  06/10/17 114/78  12/08/16 (!) 102/58  09/03/16 98/68    Wt Readings from Last 3 Encounters:  06/10/17 205 lb 9.6 oz (93.3 kg)  12/08/16 195 lb 9.6 oz (88.7 kg)  09/03/16 196 lb 9.6 oz (89.2 kg)    General appearance: alert, cooperative and appears stated age Ears: normal TM's and external ear canals both ears Throat: lips, mucosa, and tongue normal; teeth and gums normal Neck: no adenopathy, no carotid bruit, supple, symmetrical, trachea midline and thyroid not enlarged, symmetric, no tenderness/mass/nodules Back: symmetric, no curvature. ROM normal. No CVA tenderness. Lungs: clear to auscultation bilaterally Heart: regular rate and rhythm, S1, S2 normal, no murmur, click, rub or gallop Abdomen: soft, non-tender; bowel sounds normal; no masses,  no organomegaly Pulses: 2+ and symmetric Skin: Skin color, texture, turgor normal. No rashes or lesions Lymph nodes: Cervical, supraclavicular, and axillary nodes normal.  No results found for: HGBA1C  Lab Results  Component Value Date   CREATININE 0.92 06/10/2017   CREATININE 0.81 12/16/2015   CREATININE 0.74 01/17/2015    Lab Results  Component Value Date   WBC 5.2 12/18/2015   HGB 14.8 12/18/2015   HCT 44.0 12/18/2015   PLT 267.0 12/18/2015   GLUCOSE 85 06/10/2017   CHOL 166 12/16/2015   TRIG 56.0 12/16/2015   HDL 48.90 12/16/2015   LDLCALC 106 (H) 12/16/2015   ALT 9 06/10/2017   AST 10 06/10/2017   NA 139 06/10/2017   K 4.3 06/10/2017   CL 106 06/10/2017  CREATININE 0.92 06/10/2017   BUN 14 06/10/2017   CO2 26 06/10/2017   TSH 1.02 06/10/2017   INR 1.1 (H) 09/17/2015   MICROALBUR <0.7 12/16/2015    No results found.  Assessment & Plan:   Problem List Items Addressed This Visit    Irregular menstrual cycle   Relevant Orders   Comprehensive metabolic panel (Completed)   TSH (Completed)   Routine general medical examination at a health care facility - Primary   Vitamin D deficiency   Relevant  Orders   VITAMIN D 25 Hydroxy (Vit-D Deficiency, Fractures) (Completed)   Obese    She has had difficulty losing weight due to increased appetite and is requesting a trial of  Phentermine.  She is aware of the possible side effects and risks and understands that    The medication will be discontinued if she has not lost 5% of her body weight over the next 3 months, which , based on today's weight is 10 lbs.      Relevant Medications   phentermine (ADIPEX-P) 37.5 MG tablet   Paternal family history of cancer    She has requested  Genetic testing.  Referral in process.       Relevant Orders   Ambulatory referral to Genetics     A total of 25 minutes of face to face time was spent with patient more than half of which was spent in counselling about the above mentioned conditions  and coordination of care  I am having Alexis Opdahl. Little "Danielle" start on phentermine and busPIRone.  Meds ordered this encounter  Medications  . phentermine (ADIPEX-P) 37.5 MG tablet    Sig: Take 1 tablet (37.5 mg total) by mouth daily before breakfast.    Dispense:  30 tablet    Refill:  2  . busPIRone (BUSPAR) 7.5 MG tablet    Sig: Take 1 tablet (7.5 mg total) by mouth 3 (three) times daily.    Dispense:  90 tablet    Refill:  2    There are no discontinued medications.  Follow-up: No Follow-up on file.   Crecencio Mc, MD

## 2017-06-10 NOTE — Patient Instructions (Signed)
I have authorized the use of phentermine for 3 months.  Please have your vital signs checked a week after starting, and return to see me in 3 months.take 1/2  tablet in the morning,  The second half by 2 PM to avoid insomnia If you have not lost 5% of your starting weight by the end of the  3 months we will have to discontinue it.   I recommend starting the buspirone to manage your anxiety.  You can start with twice daily and add a 3rd dose if needed,,  The dose can also be increased if needed

## 2017-06-11 LAB — COMPREHENSIVE METABOLIC PANEL
AG Ratio: 2.1 (calc) (ref 1.0–2.5)
ALT: 9 U/L (ref 6–29)
AST: 10 U/L (ref 10–30)
Albumin: 4 g/dL (ref 3.6–5.1)
Alkaline phosphatase (APISO): 56 U/L (ref 33–115)
BUN: 14 mg/dL (ref 7–25)
CO2: 26 mmol/L (ref 20–32)
CREATININE: 0.92 mg/dL (ref 0.50–1.10)
Calcium: 8.9 mg/dL (ref 8.6–10.2)
Chloride: 106 mmol/L (ref 98–110)
GLUCOSE: 85 mg/dL (ref 65–99)
Globulin: 1.9 g/dL (calc) (ref 1.9–3.7)
Potassium: 4.3 mmol/L (ref 3.5–5.3)
SODIUM: 139 mmol/L (ref 135–146)
TOTAL PROTEIN: 5.9 g/dL — AB (ref 6.1–8.1)
Total Bilirubin: 0.4 mg/dL (ref 0.2–1.2)

## 2017-06-11 LAB — TSH: TSH: 1.02 mIU/L

## 2017-06-11 LAB — VITAMIN D 25 HYDROXY (VIT D DEFICIENCY, FRACTURES): Vit D, 25-Hydroxy: 21 ng/mL — ABNORMAL LOW (ref 30–100)

## 2017-06-12 ENCOUNTER — Other Ambulatory Visit: Payer: Self-pay | Admitting: Internal Medicine

## 2017-06-12 DIAGNOSIS — Z809 Family history of malignant neoplasm, unspecified: Secondary | ICD-10-CM | POA: Insufficient documentation

## 2017-06-12 MED ORDER — ERGOCALCIFEROL 1.25 MG (50000 UT) PO CAPS: 50000.0000 [IU] | ORAL_CAPSULE | ORAL | 2 refills | Status: DC

## 2017-06-12 NOTE — Assessment & Plan Note (Signed)
She has requested  Genetic testing.  Referral in process.

## 2017-06-12 NOTE — Assessment & Plan Note (Signed)
She has had difficulty losing weight due to increased appetite and is requesting a trial of  Phentermine.  She is aware of the possible side effects and risks and understands that    The medication will be discontinued if she has not lost 5% of her body weight over the next 3 months, which , based on today's weight is 10 lbs. 

## 2017-06-14 ENCOUNTER — Encounter: Payer: Self-pay | Admitting: Genetic Counselor

## 2017-06-14 ENCOUNTER — Telehealth: Payer: Self-pay | Admitting: Genetic Counselor

## 2017-06-14 DIAGNOSIS — M9901 Segmental and somatic dysfunction of cervical region: Secondary | ICD-10-CM | POA: Diagnosis not present

## 2017-06-14 DIAGNOSIS — M9902 Segmental and somatic dysfunction of thoracic region: Secondary | ICD-10-CM | POA: Diagnosis not present

## 2017-06-14 DIAGNOSIS — M4604 Spinal enthesopathy, thoracic region: Secondary | ICD-10-CM | POA: Diagnosis not present

## 2017-06-14 DIAGNOSIS — M4602 Spinal enthesopathy, cervical region: Secondary | ICD-10-CM | POA: Diagnosis not present

## 2017-06-14 NOTE — Telephone Encounter (Signed)
Cancer Genetics            Telegenetics Initial Visit    Patient Name: Alexis Little Patient DOB: 06-May-1976 Patient Age: 42 y.o. Phone Call Date: 06/14/2017  Referring Provider: Deborra Medina, MD  Reason for Visit: Evaluate for hereditary susceptibility to cancer    Assessment and Plan:  . Alexis Little's family history is not suggestive of a hereditary predisposition to cancer even with having multiple relatives with cancers given the combination and age of onset of the cancers.   . Testing is available to determine whether she has a pathogenic mutation that will impact her screening and risk-reduction for cancer, but she understood that she is not at high risk. A negative result will be reassuring.  . Alexis Little wished to pursue genetic testing and will schedule a lab visit for a blood sample. Analysis will include the 83 genes on [facility]'s Multi-Cancer panel (ALK, APC, ATM, AXIN2, BAP1, BARD1, BLM, BMPR1A, BRCA1, BRCA2, BRIP1, CASR, CDC73, CDH1, CDK4, CDKN1B, CDKN1C, CDKN2A, CEBPA, CHEK2, CTNNA1, DICER1, DIS3L2, EGFR, EPCAM, FH, FLCN, GATA2, GPC3, GREM1, HOXB13, HRAS, KIT, MAX, MEN1, MET, MITF, MLH1, MSH2, MSH3, MSH6, MUTYH, NBN, NF1, NF2, NTHL1, PALB2, PDGFRA, PHOX2B, PMS2, POLD1, POLE, POT1, PRKAR1A, PTCH1, PTEN, RAD50, RAD51C, RAD51D, RB1, RECQL4, RET, RUNX1, SDHA, SDHAF2, SDHB, SDHC, SDHD, SMAD4, SMARCA4, SMARCB1, SMARCE1, STK11, SUFU, TERC, TERT, TMEM127, TP53, TSC1, TSC2, VHL, WRN, WT1).   . Results should be available in approximately 2-3 weeks, at which point we will contact her and address implications for her as well as address genetic testing for at-risk family members, if needed.     Dr. Grayland Ormond was available for questions concerning this case. Total time spent by counseling by phone was approximately 25 minutes.   _____________________________________________________________________   History of Present Illness: Alexis Little, a 42 y.o.  female, was referred for genetic counseling to discuss the possibility of a hereditary predisposition to cancer and discuss whether genetic testing is warranted. This was a telegenetics visit via phone.  Alexis Little reported a history of SCC on her arm, but has otherwise not had any other cancer. She has a yearly mammogram and a PAP every 3 years. She indicated she had a colonoscopy in her 46s but that no polyps were identified at that time.  Past Medical History:  Diagnosis Date  . History of kidney stones   . Hyperlipidemia   . Squamous cell carcinoma, arm    Followed by Dr. Evorn Gong    Past Surgical History:  Procedure Laterality Date  . KIDNEY STONE SURGERY    . KIDNEY STONE SURGERY     surgical removal, Dr. Pearlie Oyster age 70  . VAGINAL DELIVERY     2    Family History: Significant diagnoses include the following:  Family History  Problem Relation Age of Onset  . Heart disease Father        AFIB  . Esophageal cancer Father 69       widely metastatic; smoker; deceased 73  . Lung cancer Paternal Grandfather 15       smoker; deceased 19  . Colon cancer Paternal Uncle 24       currently 75  . Prostate cancer Paternal Uncle 36       currently 34  . Throat cancer Paternal Uncle        non-smoker; currently 69  . Cancer Cousin 18       in  jaw; daughter of pat uncle with throad ca  . Pancreatic cancer Other 37       pat grandfather's sister; non-smoker; deceased 14s  . Cancer Other        lung and larynx ca in 2 other sisters of pat grandfather    Additionally, Alexis Little has a son (age 44) and a daughter (age 3). She has no full siblings; she has 2 paternal half-brothers (ages 68 and 95). Her mother (age 32) is cancer-free as are her mother's maternal half-siblings. Her father had only 3 brothers (noted above) and no sisters.  Alexis Little ancestry is Caucasian - NOS. There is no known Jewish ancestry and no consanguinity.  Discussion: We reviewed the characteristics,  features and inheritance patterns of hereditary cancer syndromes. We discussed her risk of harboring a mutation in the context of her personal and family history. We discussed that the paucity of women in her paternal family makes risk assessment challenging. We discussed the process of genetic testing, insurance coverage and implications of results: positive, negative and variant of unknown significance (VUS).   Alexis Little questions were answered to her satisfaction today and she is welcome to call with any additional questions or concerns. Thank you for the referral and allowing [place] to share in the care of your patient.    Steele Berg, MS, [place] Certified Genetic Counselor phone: [phone]

## 2017-06-16 ENCOUNTER — Ambulatory Visit (INDEPENDENT_AMBULATORY_CARE_PROVIDER_SITE_OTHER): Payer: BLUE CROSS/BLUE SHIELD

## 2017-06-16 DIAGNOSIS — E538 Deficiency of other specified B group vitamins: Secondary | ICD-10-CM | POA: Diagnosis not present

## 2017-06-16 MED ORDER — CYANOCOBALAMIN 1000 MCG/ML IJ SOLN
1000.0000 ug | Freq: Once | INTRAMUSCULAR | Status: AC
  Administered 2017-06-16: 1000 ug via INTRAMUSCULAR

## 2017-06-16 NOTE — Progress Notes (Signed)
Patient presented for B 12 injection to left deltoid, patient voiced no concerns nor showed any signs of distress during injection. 

## 2017-06-21 ENCOUNTER — Inpatient Hospital Stay: Payer: BLUE CROSS/BLUE SHIELD | Attending: Oncology

## 2017-06-22 ENCOUNTER — Telehealth: Payer: Self-pay | Admitting: Internal Medicine

## 2017-06-22 ENCOUNTER — Telehealth: Payer: Self-pay

## 2017-06-22 MED ORDER — BUSPIRONE HCL 7.5 MG PO TABS: 7.5000 mg | ORAL_TABLET | Freq: Three times a day (TID) | ORAL | 2 refills | Status: DC

## 2017-06-22 MED ORDER — PHENTERMINE HCL 37.5 MG PO TABS: 37.5000 mg | ORAL_TABLET | Freq: Every day | ORAL | 2 refills | Status: DC

## 2017-06-22 MED ORDER — ERGOCALCIFEROL 1.25 MG (50000 UT) PO CAPS: 50000.0000 [IU] | ORAL_CAPSULE | ORAL | 2 refills | Status: DC

## 2017-06-22 NOTE — Telephone Encounter (Signed)
signed

## 2017-06-22 NOTE — Telephone Encounter (Signed)
Medications have been faxed to [place]

## 2017-06-22 NOTE — Telephone Encounter (Signed)
Copied from [place] [phone]. Topic: General - Other >> Jun 22, 2017  1:08 PM Lolita Rieger, [place] wrote: Reason for VUD:THYHOO from [place] called and needs a new script for all of pts medications adipex 37.5, dridol 50000 and buspar 7.5 faxed or called in [phone] is the fax [phone] is the phone number due to [facility] losing pt's prescriptions

## 2017-06-22 NOTE — Telephone Encounter (Signed)
Refilled all rxs. Phentermine rx has been placed in quick sign folder.

## 2017-06-22 NOTE — Telephone Encounter (Signed)
Faxed

## 2017-06-22 NOTE — Telephone Encounter (Signed)
Copied from [place] [phone]. Topic: General - Other >> Jun 22, 2017  1:08 PM Lolita Rieger, [place] wrote: Reason for RAQ:TMAUQJ from [facility] called and needs a new script for all of pts medications adipex 37.5, dridol 50000 and buspar 7.5 faxed or called in [phone] is the fax [phone] is the phone number due to [facility] losing pt's prescriptions  >> Jun 22, 2017  2:11 PM Johna Sheriff, CMA wrote: Duplicate message

## 2017-06-30 ENCOUNTER — Telehealth: Payer: Self-pay | Admitting: Genetic Counselor

## 2017-06-30 NOTE — Telephone Encounter (Addendum)
Cancer Genetics             Telegenetics Results Disclosure   Patient Name: Alexis Little Patient DOB: [birth year] Patient Age: 42 y.o. Phone Call Date: 06/30/2017  Referring Provider: Delight Hoh, MD    Ms. Icenhour was called today to discuss genetic test results. Please see the Genetics telephone note from 06/14/17 for a detailed discussion of her personal and family histories and the recommendations provided.  Genetic Testing: At the time of Ms. Renken's telegenetics visit, she decided to pursue genetic testing of multiple genes associated with hereditary susceptibility to cancer. Testing included sequencing and deletion/duplication analysis. Testing did not reveal any pathogenic mutation in any of these genes.  A copy of the genetic test report will be scanned into Epic under the Media tab.  The genes analyzed were the 83 genes on [facility]'s Multi-Cancer panel (ALK, APC, ATM, AXIN2, BAP1, BARD1, BLM, BMPR1A, BRCA1, BRCA2, BRIP1, CASR, CDC73, CDH1, CDK4, CDKN1B, CDKN1C, CDKN2A, CEBPA, CHEK2, CTNNA1, DICER1, DIS3L2, EGFR, EPCAM, FH, FLCN, GATA2, GPC3, GREM1, HOXB13, HRAS, KIT, MAX, MEN1, MET, MITF, MLH1, MSH2, MSH3, MSH6, MUTYH, NBN, NF1, NF2, NTHL1, PALB2, PDGFRA, PHOX2B, PMS2, POLD1, POLE, POT1, PRKAR1A, PTCH1, PTEN, RAD50, RAD51C, RAD51D, RB1, RECQL4, RET, RUNX1, SDHA, SDHAF2, SDHB, SDHC, SDHD, SMAD4, SMARCA4, SMARCB1, SMARCE1, STK11, SUFU, TERC, TERT, TMEM127, TP53, TSC1, TSC2, VHL, WRN, WT1).  Since the current test is not perfect, it is possible that there may be a gene mutation that current testing cannot detect, but that chance is small. It is possible that a different genetic factor, which has not yet been discovered or is not on this panel, is responsible for the cancer diagnoses in the family. Again, the likelihood of this is low. No additional testing is recommended at this time for Ms. Gorgas.  Two Variants of Uncertain Significance was detected:  RECQL4 c.2260C>T (p.Arg754Trp and SDHA c.1428G>C (p.Arg476Ser). This is still considered a normal result. While at this time, it is unknown if this finding is associated with increased cancer risk, the majority of these variants get reclassified to be inconsequential. Medical management should not be based on this finding. With time, we suspect the lab will determine the significance, if any. If we do learn more about it, we will try to contact Ms. Metsker to discuss it further. It is important to stay in touch with [place] periodically and keep the address and phone number up to date. 04/03/2018 - An updated report was received today from [facility] indicating that the RECQL4 VUS previously identified in Ms. Vanvranken has been downgraded to Variant, Likely Benign. A copy of this new report will be sent to get scanned and was made available to her.  Cancer Screening: These results are reassuring and indicate that Ms. Totzke does not likely have an increased risk of cancer due to a mutation in one of these genes. We discussed undergoing cancer screenings for individuals in the general population. The [employer]  recommends that women follow the breast screening recommendations below, but that these may need to be modified based on other risk factors such as dense breasts, biopsy history or family history.  Breast awareness - Women should be familiar with their breasts and promptly report changes to their healthcare provier.   Between ages 7-39: Breast exam, risk assessment, and risk reduction counseling by the provider every 1-3 years.   Starting at  age 51: Breast exam, risk assessment, and risk reduction counseling by the provider and mammogram every year. The provider may discuss screening with tomosynthesis.  Ms. Nawabi is also recommended to undergo a yearly gynecologic exam and initiate colon cancer screening at age 3.  Family Members: Family members may also be at some increased  risk of developing cancer, over the general population risk, simply due to the family history. Women are recommended to have a yearly mammogram beginning at age 78, a yearly clinical breast exam, a yearly gynecologic exam and perform monthly breast self-exams. Colon cancer screening is recommended to begin by age 45 in both men and women, unless there is a family history of colon cancer or colon polyps or an individual has a personal history to warrant initiating screening at a younger age.  Any relative who had cancer at a young age or had a particularly rare cancer may also wish to pursue genetic testing. Genetic counselors can be located in other cities, by visiting the website of the [employer] ([employer]ist[employer].se) and Field seismologist for a Dietitian by zip code.   Family members are not recommended to get tested for the above VUSs outside of a research protocol as this finding has no implications for their medical management.  Lastly, cancer genetics is a rapidly advancing field and it is possible that new genetic tests will be appropriate for Ms. Wenberg in the future. We encourage Ms. Narez to remain in contact with Genetics on an annual basis so her personal and family histories can be updated.    Steele Berg, MS, [place] Certified Genetic Counselor phone: [phone]

## 2017-07-05 ENCOUNTER — Ambulatory Visit (INDEPENDENT_AMBULATORY_CARE_PROVIDER_SITE_OTHER): Payer: BLUE CROSS/BLUE SHIELD | Admitting: *Deleted

## 2017-07-05 DIAGNOSIS — E538 Deficiency of other specified B group vitamins: Secondary | ICD-10-CM | POA: Diagnosis not present

## 2017-07-05 MED ORDER — CYANOCOBALAMIN 1000 MCG/ML IJ SOLN
1000.0000 ug | Freq: Once | INTRAMUSCULAR | Status: AC
  Administered 2017-07-05: 1000 ug via INTRAMUSCULAR

## 2017-07-05 NOTE — Progress Notes (Addendum)
Patient presented for B 12 injection to right deltoid, patient voiced no concerns nor showed any signs of distress during injection.  Reviewed.  Dr Scott 

## 2017-07-19 ENCOUNTER — Ambulatory Visit (INDEPENDENT_AMBULATORY_CARE_PROVIDER_SITE_OTHER): Payer: BLUE CROSS/BLUE SHIELD | Admitting: *Deleted

## 2017-07-19 DIAGNOSIS — E538 Deficiency of other specified B group vitamins: Secondary | ICD-10-CM

## 2017-07-19 MED ORDER — CYANOCOBALAMIN 1000 MCG/ML IJ SOLN
1000.0000 ug | Freq: Once | INTRAMUSCULAR | Status: AC
  Administered 2017-07-19: 1000 ug via INTRAMUSCULAR

## 2017-07-19 NOTE — Progress Notes (Signed)
Patient presented for B 12 injection to right deltoid, patient voiced no concerns nor showed any signs of distress during injection. 

## 2017-07-20 ENCOUNTER — Telehealth: Payer: Self-pay | Admitting: Internal Medicine

## 2017-07-20 NOTE — Telephone Encounter (Signed)
Copied from [place]. Topic: Quick Communication - See Telephone Encounter >> Jul 20, 2017  4:27 PM Bea Graff, [place] wrote: CRM for notification. See Telephone encounter for: 07/20/17. Seth Bake with [employer] calling to inform Dr. Derrel Nip that this pt has signed up with them for case management about general health. CB#: [phone]

## 2017-07-20 NOTE — Progress Notes (Signed)
  I have reviewed the above information and agree with above.   Teresa Tullo, MD 

## 2017-07-21 NOTE — Telephone Encounter (Signed)
fyi

## 2017-08-02 ENCOUNTER — Ambulatory Visit (INDEPENDENT_AMBULATORY_CARE_PROVIDER_SITE_OTHER): Payer: BLUE CROSS/BLUE SHIELD

## 2017-08-02 DIAGNOSIS — E538 Deficiency of other specified B group vitamins: Secondary | ICD-10-CM | POA: Diagnosis not present

## 2017-08-02 MED ORDER — CYANOCOBALAMIN 1000 MCG/ML IJ SOLN
1000.0000 ug | Freq: Once | INTRAMUSCULAR | Status: AC
Start: 2017-08-02 — End: 2017-08-02
  Administered 2017-08-02: 1000 ug via INTRAMUSCULAR

## 2017-08-02 NOTE — Progress Notes (Addendum)
Patient came in for B12 injection. Given in left deltoid. Tolerated well, no complaints or concerns.   Reviewed.  Dr Nicki Reaper

## 2017-08-16 ENCOUNTER — Ambulatory Visit (INDEPENDENT_AMBULATORY_CARE_PROVIDER_SITE_OTHER): Payer: BLUE CROSS/BLUE SHIELD

## 2017-08-16 DIAGNOSIS — E538 Deficiency of other specified B group vitamins: Secondary | ICD-10-CM | POA: Diagnosis not present

## 2017-08-16 MED ORDER — CYANOCOBALAMIN 1000 MCG/ML IJ SOLN
1000.0000 ug | Freq: Once | INTRAMUSCULAR | Status: AC
  Administered 2017-08-16: 1000 ug via INTRAMUSCULAR

## 2017-08-16 NOTE — Progress Notes (Signed)
Patient comes in for B 12 injection.  Injected right deltoid.  Patient tolerated injection well.   

## 2017-08-30 ENCOUNTER — Ambulatory Visit: Payer: Self-pay

## 2017-09-27 ENCOUNTER — Ambulatory Visit (INDEPENDENT_AMBULATORY_CARE_PROVIDER_SITE_OTHER): Payer: BLUE CROSS/BLUE SHIELD | Admitting: *Deleted

## 2017-09-27 DIAGNOSIS — E538 Deficiency of other specified B group vitamins: Secondary | ICD-10-CM | POA: Diagnosis not present

## 2017-09-27 MED ORDER — CYANOCOBALAMIN 1000 MCG/ML IJ SOLN
1000.0000 ug | Freq: Once | INTRAMUSCULAR | Status: AC
  Administered 2017-09-27: 1000 ug via INTRAMUSCULAR

## 2017-09-27 NOTE — Progress Notes (Signed)
Patient presented for B 12 injection to left deltoid, patient voiced no concerns nor showed any signs of distress during injection. 

## 2017-10-12 ENCOUNTER — Ambulatory Visit (INDEPENDENT_AMBULATORY_CARE_PROVIDER_SITE_OTHER): Payer: BLUE CROSS/BLUE SHIELD | Admitting: *Deleted

## 2017-10-12 DIAGNOSIS — E538 Deficiency of other specified B group vitamins: Secondary | ICD-10-CM | POA: Diagnosis not present

## 2017-10-12 MED ORDER — CYANOCOBALAMIN 1000 MCG/ML IJ SOLN
1000.0000 ug | Freq: Once | INTRAMUSCULAR | Status: AC
  Administered 2017-10-12: 1000 ug via INTRAMUSCULAR

## 2017-10-17 NOTE — Progress Notes (Signed)
  I have reviewed the above information and agree with above.   Caylyn Tedeschi, MD 

## 2017-11-02 ENCOUNTER — Encounter: Payer: Self-pay | Admitting: Internal Medicine

## 2017-11-02 ENCOUNTER — Ambulatory Visit (INDEPENDENT_AMBULATORY_CARE_PROVIDER_SITE_OTHER): Payer: BLUE CROSS/BLUE SHIELD

## 2017-11-02 ENCOUNTER — Ambulatory Visit (INDEPENDENT_AMBULATORY_CARE_PROVIDER_SITE_OTHER): Payer: BLUE CROSS/BLUE SHIELD | Admitting: Internal Medicine

## 2017-11-02 VITALS — BP 114/78 | HR 81 | Temp 98.2°F | Resp 15 | Ht 68.0 in | Wt 205.6 lb

## 2017-11-02 DIAGNOSIS — Z6831 Body mass index (BMI) 31.0-31.9, adult: Secondary | ICD-10-CM | POA: Diagnosis not present

## 2017-11-02 DIAGNOSIS — R05 Cough: Secondary | ICD-10-CM | POA: Diagnosis not present

## 2017-11-02 DIAGNOSIS — R0602 Shortness of breath: Secondary | ICD-10-CM | POA: Diagnosis not present

## 2017-11-02 DIAGNOSIS — E66811 Obesity, class 1: Secondary | ICD-10-CM

## 2017-11-02 DIAGNOSIS — Z0000 Encounter for general adult medical examination without abnormal findings: Secondary | ICD-10-CM | POA: Diagnosis not present

## 2017-11-02 DIAGNOSIS — Z809 Family history of malignant neoplasm, unspecified: Secondary | ICD-10-CM | POA: Diagnosis not present

## 2017-11-02 DIAGNOSIS — R059 Cough, unspecified: Secondary | ICD-10-CM

## 2017-11-02 DIAGNOSIS — E6609 Other obesity due to excess calories: Secondary | ICD-10-CM

## 2017-11-02 MED ORDER — NALTREXONE-BUPROPION HCL ER 8-90 MG PO TB12: ORAL_TABLET | ORAL | 0 refills | Status: DC

## 2017-11-02 NOTE — Patient Instructions (Addendum)
For your allergies ,   these  Are the newer second generation antihistamines that are longer acting, non sedating and  available OTC:  Generic  Zyrtec, which is cetirizine.    generic Allegra , available generically as fexofenadine ; comes in 60 mg and 180 mg once daily strengths.    Generic Claritin :  also available as loratidine .      Health Maintenance, Female Adopting a healthy lifestyle and getting preventive care can go a long way to promote health and wellness. Talk with your health care provider about what schedule of regular examinations is right for you. This is a good chance for you to check in with your provider about disease prevention and staying healthy. In between checkups, there are plenty of things you can do on your own. Experts have done a lot of research about which lifestyle changes and preventive measures are most likely to keep you healthy. Ask your health care provider for more information. Weight and diet Eat a healthy diet  Be sure to include plenty of vegetables, fruits, low-fat dairy products, and lean protein.  Do not eat a lot of foods high in solid fats, added sugars, or salt.  Get regular exercise. This is one of the most important things you can do for your health. ? Most adults should exercise for at least 150 minutes each week. The exercise should increase your heart rate and make you sweat (moderate-intensity exercise). ? Most adults should also do strengthening exercises at least twice a week. This is in addition to the moderate-intensity exercise.  Maintain a healthy weight  Body mass index (BMI) is a measurement that can be used to identify possible weight problems. It estimates body fat based on height and weight. Your health care provider can help determine your BMI and help you achieve or maintain a healthy weight.  For females 40 years of age and older: ? A BMI below 18.5 is considered underweight. ? A BMI of 18.5 to 24.9 is normal. ? A  BMI of 25 to 29.9 is considered overweight. ? A BMI of 30 and above is considered obese.  Watch levels of cholesterol and blood lipids  You should start having your blood tested for lipids and cholesterol at 44 years of age, then have this test every 5 years.  You may need to have your cholesterol levels checked more often if: ? Your lipid or cholesterol levels are high. ? You are older than 44 years of age. ? You are at high risk for heart disease.  Cancer screening Lung Cancer  Lung cancer screening is recommended for adults 106-43 years old who are at high risk for lung cancer because of a history of smoking.  A yearly low-dose CT scan of the lungs is recommended for people who: ? Currently smoke. ? Have quit within the past 15 years. ? Have at least a 30-pack-year history of smoking. A pack year is smoking an average of one pack of cigarettes a day for 1 year.  Yearly screening should continue until it has been 15 years since you quit.  Yearly screening should stop if you develop a health problem that would prevent you from having lung cancer treatment.  Breast Cancer  Practice breast self-awareness. This means understanding how your breasts normally appear and feel.  It also means doing regular breast self-exams. Let your health care provider know about any changes, no matter how small.  If you are in your 20s or 30s, you  should have a clinical breast exam (CBE) by a health care provider every 1-3 years as part of a regular health exam.  If you are 39 or older, have a CBE every year. Also consider having a breast X-ray (mammogram) every year.  If you have a family history of breast cancer, talk to your health care provider about genetic screening.  If you are at high risk for breast cancer, talk to your health care provider about having an MRI and a mammogram every year.  Breast cancer gene (BRCA) assessment is recommended for women who have family members with  BRCA-related cancers. BRCA-related cancers include: ? Breast. ? Ovarian. ? Tubal. ? Peritoneal cancers.  Results of the assessment will determine the need for genetic counseling and BRCA1 and BRCA2 testing.  Cervical Cancer Your health care provider may recommend that you be screened regularly for cancer of the pelvic organs (ovaries, uterus, and vagina). This screening involves a pelvic examination, including checking for microscopic changes to the surface of your cervix (Pap test). You may be encouraged to have this screening done every 3 years, beginning at age 63.  For women ages 63-65, health care providers may recommend pelvic exams and Pap testing every 3 years, or they may recommend the Pap and pelvic exam, combined with testing for human papilloma virus (HPV), every 5 years. Some types of HPV increase your risk of cervical cancer. Testing for HPV may also be done on women of any age with unclear Pap test results.  Other health care providers may not recommend any screening for nonpregnant women who are considered low risk for pelvic cancer and who do not have symptoms. Ask your health care provider if a screening pelvic exam is right for you.  If you have had past treatment for cervical cancer or a condition that could lead to cancer, you need Pap tests and screening for cancer for at least 20 years after your treatment. If Pap tests have been discontinued, your risk factors (such as having a new sexual partner) need to be reassessed to determine if screening should resume. Some women have medical problems that increase the chance of getting cervical cancer. In these cases, your health care provider may recommend more frequent screening and Pap tests.  Colorectal Cancer  This type of cancer can be detected and often prevented.  Routine colorectal cancer screening usually begins at 44 years of age and continues through 44 years of age.  Your health care provider may recommend screening  at an earlier age if you have risk factors for colon cancer.  Your health care provider may also recommend using home test kits to check for hidden blood in the stool.  A small camera at the end of a tube can be used to examine your colon directly (sigmoidoscopy or colonoscopy). This is done to check for the earliest forms of colorectal cancer.  Routine screening usually begins at age 42.  Direct examination of the colon should be repeated every 5-10 years through 44 years of age. However, you may need to be screened more often if early forms of precancerous polyps or small growths are found.  Skin Cancer  Check your skin from head to toe regularly.  Tell your health care provider about any new moles or changes in moles, especially if there is a change in a mole's shape or color.  Also tell your health care provider if you have a mole that is larger than the size of a pencil eraser.  Always use sunscreen. Apply sunscreen liberally and repeatedly throughout the day.  Protect yourself by wearing long sleeves, pants, a wide-brimmed hat, and sunglasses whenever you are outside.  Heart disease, diabetes, and high blood pressure  High blood pressure causes heart disease and increases the risk of stroke. High blood pressure is more likely to develop in: ? People who have blood pressure in the high end of the normal range (130-139/85-89 mm Hg). ? People who are overweight or obese. ? People who are African American.  If you are 3-67 years of age, have your blood pressure checked every 3-5 years. If you are 41 years of age or older, have your blood pressure checked every year. You should have your blood pressure measured twice-once when you are at a hospital or clinic, and once when you are not at a hospital or clinic. Record the average of the two measurements. To check your blood pressure when you are not at a hospital or clinic, you can use: ? An automated blood pressure machine at a  pharmacy. ? A home blood pressure monitor.  If you are between 79 years and 8 years old, ask your health care provider if you should take aspirin to prevent strokes.  Have regular diabetes screenings. This involves taking a blood sample to check your fasting blood sugar level. ? If you are at a normal weight and have a low risk for diabetes, have this test once every three years after 44 years of age. ? If you are overweight and have a high risk for diabetes, consider being tested at a younger age or more often. Preventing infection Hepatitis B  If you have a higher risk for hepatitis B, you should be screened for this virus. You are considered at high risk for hepatitis B if: ? You were born in a country where hepatitis B is common. Ask your health care provider which countries are considered high risk. ? Your parents were born in a high-risk country, and you have not been immunized against hepatitis B (hepatitis B vaccine). ? You have HIV or AIDS. ? You use needles to inject street drugs. ? You live with someone who has hepatitis B. ? You have had sex with someone who has hepatitis B. ? You get hemodialysis treatment. ? You take certain medicines for conditions, including cancer, organ transplantation, and autoimmune conditions.  Hepatitis C  Blood testing is recommended for: ? Everyone born from [birth year] through 1967. ? Anyone with known risk factors for hepatitis C.  Sexually transmitted infections (STIs)  You should be screened for sexually transmitted infections (STIs) including gonorrhea and chlamydia if: ? You are sexually active and are younger than 44 years of age. ? You are older than 44 years of age and your health care provider tells you that you are at risk for this type of infection. ? Your sexual activity has changed since you were last screened and you are at an increased risk for chlamydia or gonorrhea. Ask your health care provider if you are at risk.  If you do not  have HIV, but are at risk, it may be recommended that you take a prescription medicine daily to prevent HIV infection. This is called pre-exposure prophylaxis (PrEP). You are considered at risk if: ? You are sexually active and do not regularly use condoms or know the HIV status of your partner(s). ? You take drugs by injection. ? You are sexually active with a partner who has HIV.  Talk with  your health care provider about whether you are at high risk of being infected with HIV. If you choose to begin PrEP, you should first be tested for HIV. You should then be tested every 3 months for as long as you are taking PrEP. Pregnancy  If you are premenopausal and you may become pregnant, ask your health care provider about preconception counseling.  If you may become pregnant, take 400 to 800 micrograms (mcg) of folic acid every day.  If you want to prevent pregnancy, talk to your health care provider about birth control (contraception). Osteoporosis and menopause  Osteoporosis is a disease in which the bones lose minerals and strength with aging. This can result in serious bone fractures. Your risk for osteoporosis can be identified using a bone density scan.  If you are 62 years of age or older, or if you are at risk for osteoporosis and fractures, ask your health care provider if you should be screened.  Ask your health care provider whether you should take a calcium or vitamin D supplement to lower your risk for osteoporosis.  Menopause may have certain physical symptoms and risks.  Hormone replacement therapy may reduce some of these symptoms and risks. Talk to your health care provider about whether hormone replacement therapy is right for you. Follow these instructions at home:  Schedule regular health, dental, and eye exams.  Stay current with your immunizations.  Do not use any tobacco products including cigarettes, chewing tobacco, or electronic cigarettes.  If you are pregnant,  do not drink alcohol.  If you are breastfeeding, limit how much and how often you drink alcohol.  Limit alcohol intake to no more than 1 drink per day for nonpregnant women. One drink equals 12 ounces of beer, 5 ounces of wine, or 1 ounces of hard liquor.  Do not use street drugs.  Do not share needles.  Ask your health care provider for help if you need support or information about quitting drugs.  Tell your health care provider if you often feel depressed.  Tell your health care provider if you have ever been abused or do not feel safe at home. This information is not intended to replace advice given to you by your health care provider. Make sure you discuss any questions you have with your health care provider. Document Released: 11/23/2010 Document Revised: 10/16/2015 Document Reviewed: 02/11/2015 Elsevier Interactive Patient Education  [employer].

## 2017-11-02 NOTE — Progress Notes (Signed)
Patient ID: Alexis Little, female    DOB: 1975/11/28  Age: 42 y.o. MRN: [id number]  The patient is here for annual preventive  examination and management of other chronic and acute problems.   Last PAP 2017 normal  mammogram normal July 2018   The risk factors are reflected in the social history.  The roster of all physicians providing medical care to patient - is listed in the Snapshot section of the chart.  Activities of daily living:  The patient is 100% independent in all ADLs: dressing, toileting, feeding as well as independent mobility  Home safety : The patient has smoke detectors in the home. They wear seatbelts.  There are no firearms at home. There is no violence in the home.   There is no risks for hepatitis, STDs or HIV. There is no   history of blood transfusion. They have no travel history to infectious disease endemic areas of the world.  The patient has seen their dentist in the last six month. They have seen their eye doctor in the last year   They do not  have excessive sun exposure. Discussed the need for sun protection: hats, long sleeves and use of sunscreen if there is significant sun exposure.   Diet: the importance of a healthy diet is discussed. They do have a healthy diet.  The benefits of regular aerobic exercise were discussed. She walks 4 times per week ,  20 minutes.   Depression screen: there are no signs or vegative symptoms of depression- irritability, change in appetite, anhedonia, sadness/tearfullness.   The following portions of the patient's history were reviewed and updated as appropriate: allergies, current medications, past family history, past medical history,  past surgical history, past social history  and problem list.  Visual acuity was not assessed per patient preference since she has regular follow up with her ophthalmologist. Hearing and body mass index were assessed and reviewed.   During the course of the visit the patient was educated  and counseled about appropriate screening and preventive services including : fall prevention , diabetes screening, nutrition counseling, colorectal cancer screening, and recommended immunizations.    CC: The primary encounter diagnosis was Cough. Diagnoses of Routine general medical examination at a health care facility, Class 1 obesity due to excess calories without serious comorbidity with body mass index (BMI) of 31.0 to 31.9 in adult, Cough in adult, and Paternal family history of cancer were also pertinent to this visit.  1) GAD:  No longer taking buspirone/phenterrine due to frequent mood  changes. Discussed using Contrave as alterrative given failure or nonavailability of the alternative choices  2) recurrent cough.  No productive,  Present for several weeks.   improves with abuterol,   Discussed obtaining chest   X ray, using Saline flushes  Antihistamine     History Alexis Little has a past medical history of History of kidney stones, Hyperlipidemia, and Squamous cell carcinoma, arm.   She has a past surgical history that includes Kidney stone surgery; Kidney stone surgery; and Vaginal delivery.   Her family history includes Cancer in her other; Cancer (age of onset: 65) in her cousin; Colon cancer (age of onset: 101) in her paternal uncle; Esophageal cancer (age of onset: 43) in her father; Heart disease in her father; Lung cancer (age of onset: 35) in her paternal grandfather; Pancreatic cancer (age of onset: 69) in her other; Prostate cancer (age of onset: 51) in her paternal uncle; Throat cancer in her paternal uncle.She reports  that she has never smoked. She has never used smokeless tobacco. She reports that she does not drink alcohol or use drugs.  Outpatient Medications Prior to Visit  Medication Sig Dispense Refill  . ergocalciferol (DRISDOL) 50000 units capsule Take 1 capsule (50,000 Units total) by mouth once a week. 4 capsule 2  . busPIRone (BUSPAR) 7.5 MG tablet Take 1 tablet (7.5 mg  total) by mouth 3 (three) times daily. (Patient not taking: Reported on 11/02/2017) 90 tablet 2  . phentermine (ADIPEX-P) 37.5 MG tablet Take 1 tablet (37.5 mg total) by mouth daily before breakfast. (Patient not taking: Reported on 11/02/2017) 30 tablet 2   No facility-administered medications prior to visit.     Review of Systems   Patient denies headache, fevers, malaise, unintentional weight loss, skin rash, eye pain, sinus congestion and sinus pain, sore throat, dysphagia,  hemoptysis , cough, dyspnea, wheezing, chest pain, palpitations, orthopnea, edema, abdominal pain, nausea, melena, diarrhea, constipation, flank pain, dysuria, hematuria, urinary  Frequency, nocturia, numbness, tingling, seizures,  Focal weakness, Loss of consciousness,  Tremor, insomnia, depression, anxiety, and suicidal ideation.      Objective:  BP 114/78 (BP Location: Left Arm, Patient Position: Sitting, Cuff Size: Normal)   Pulse 81   Temp 98.2 F (36.8 C) (Oral)   Resp 15   Ht 5\' 8"  (1.727 m)   Wt 205 lb 9.6 oz (93.3 kg)   SpO2 98%   BMI 31.26 kg/m   Physical Exam   General appearance: alert, cooperative and appears stated age Ears: normal TM's and external ear canals both ears Throat: lips, mucosa, and tongue normal; teeth and gums normal Neck: no adenopathy, no carotid bruit, supple, symmetrical, trachea midline and thyroid not enlarged, symmetric, no tenderness/mass/nodules Back: symmetric, no curvature. ROM normal. No CVA tenderness. Lungs: clear to auscultation bilaterally Heart: regular rate and rhythm, S1, S2 normal, no murmur, click, rub or gallop Abdomen: soft, non-tender; bowel sounds normal; no masses,  no organomegaly Pulses: 2+ and symmetric Skin: Skin color, texture, turgor normal. No rashes or lesions Lymph nodes: Cervical, supraclavicular, and axillary nodes normal.     Assessment & Plan:   Problem List Items Addressed This Visit    Routine general medical examination at a  health care facility    Annual comprehensive preventive exam was done as well as an evaluation and management of chronic conditions .  During the course of the visit the patient was educated and counseled about appropriate screening and preventive services including :  diabetes screening, lipid analysis with projected  10 year  risk for CAD , nutrition counseling, breast, cervical and colorectal cancer screening, and recommended immunizations.  Printed recommendations for health maintenance screenings was given      Paternal family history of cancer    She was referred for genetic testing .  Results were negative for any commonly seen mutations      Obese    Treatment options reviewed.  Trial of Contrave discussed       Relevant Medications   Naltrexone-buPROPion HCl ER 8-90 MG TB12   Cough in adult    Chest x ray and exam non suggestive of asthma.  Recommend trial of antihistamine, saline irrigation prn,  And PPI        Other Visit Diagnoses    Cough    -  Primary   Relevant Orders   DG Chest 2 View (Completed)      I have discontinued Alyda Megna. Elsea "Danielle"'s busPIRone and phentermine.  I am also having her start on Naltrexone-buPROPion HCl ER. Additionally, I am having her maintain her ergocalciferol.  Meds ordered this encounter  Medications  . Naltrexone-buPROPion HCl ER 8-90 MG TB12    Sig: One tablet every morning for one week, then twice daily for one week.. Increase gradually to 2 tablets twice daily    Dispense:  120 tablet    Refill:  0    Medications Discontinued During This Encounter  Medication Reason  . busPIRone (BUSPAR) 7.5 MG tablet Patient has not taken in last 30 days  . phentermine (ADIPEX-P) 37.5 MG tablet Patient has not taken in last 30 days    Follow-up: No follow-ups on file.   Crecencio Mc, MD

## 2017-11-03 ENCOUNTER — Ambulatory Visit (INDEPENDENT_AMBULATORY_CARE_PROVIDER_SITE_OTHER): Payer: BLUE CROSS/BLUE SHIELD | Admitting: *Deleted

## 2017-11-03 DIAGNOSIS — E538 Deficiency of other specified B group vitamins: Secondary | ICD-10-CM

## 2017-11-03 MED ORDER — CYANOCOBALAMIN 1000 MCG/ML IJ SOLN
1000.0000 ug | Freq: Once | INTRAMUSCULAR | Status: AC
  Administered 2017-11-03: 1000 ug via INTRAMUSCULAR

## 2017-11-03 NOTE — Progress Notes (Signed)
Patient presented for B 12 injection to left deltoid, patient voiced no concerns nor showed any signs of distress during injection. 

## 2017-11-05 DIAGNOSIS — R059 Cough, unspecified: Secondary | ICD-10-CM | POA: Insufficient documentation

## 2017-11-05 DIAGNOSIS — R05 Cough: Secondary | ICD-10-CM | POA: Insufficient documentation

## 2017-11-05 NOTE — Assessment & Plan Note (Signed)
Annual comprehensive preventive exam was done as well as an evaluation and management of chronic conditions .  During the course of the visit the patient was educated and counseled about appropriate screening and preventive services including :  diabetes screening, lipid analysis with projected  10 year  risk for CAD , nutrition counseling, breast, cervical and colorectal cancer screening, and recommended immunizations.  Printed recommendations for health maintenance screenings was given 

## 2017-11-05 NOTE — Assessment & Plan Note (Signed)
Treatment options reviewed.  Trial of Contrave discussed

## 2017-11-05 NOTE — Assessment & Plan Note (Signed)
She was referred for genetic testing .  Results were negative for any commonly seen mutations 

## 2017-11-05 NOTE — Assessment & Plan Note (Signed)
Chest x ray and exam non suggestive of asthma.  Recommend trial of antihistamine, saline irrigation prn,  And PPI

## 2017-11-17 ENCOUNTER — Ambulatory Visit (INDEPENDENT_AMBULATORY_CARE_PROVIDER_SITE_OTHER): Payer: BLUE CROSS/BLUE SHIELD

## 2017-11-17 ENCOUNTER — Other Ambulatory Visit: Payer: Self-pay | Admitting: Internal Medicine

## 2017-11-17 ENCOUNTER — Telehealth: Payer: Self-pay | Admitting: Internal Medicine

## 2017-11-17 DIAGNOSIS — E538 Deficiency of other specified B group vitamins: Secondary | ICD-10-CM | POA: Diagnosis not present

## 2017-11-17 MED ORDER — "SYRINGE 25G X 1"" 3 ML MISC": 0 refills | Status: DC

## 2017-11-17 MED ORDER — CYANOCOBALAMIN 1000 MCG/ML IJ SOLN
1000.0000 ug | Freq: Once | INTRAMUSCULAR | Status: AC
  Administered 2017-11-17: 1000 ug via INTRAMUSCULAR

## 2017-11-17 MED ORDER — CYANOCOBALAMIN 1000 MCG/ML IJ SOLN: 1000.0000 ug | INTRAMUSCULAR | 1 refills | Status: DC

## 2017-11-17 NOTE — Telephone Encounter (Signed)
yes

## 2017-11-17 NOTE — Progress Notes (Signed)
Lab Results  Component Value Date   VITAMINB12 269 12/18/2015

## 2017-11-17 NOTE — Telephone Encounter (Signed)
Pt stated that she gets her b12 injections every two weeks is it okay to change the rx and resend it.

## 2017-11-17 NOTE — Progress Notes (Signed)
B-12 injection given in right deltoid, patient tolerated well 

## 2017-11-17 NOTE — Telephone Encounter (Signed)
Copied from [place] [phone]. Topic: General - Other >> Nov 17, 2017  2:03 PM Lennox Solders wrote: Reason for CRM: pt is calling and she get her b12 inj every two week not once every 30 days. Pt was seen today. [facility]

## 2017-11-17 NOTE — Telephone Encounter (Signed)
Please advise 

## 2017-11-18 MED ORDER — CYANOCOBALAMIN 1000 MCG/ML IJ SOLN: 1000.0000 ug | INTRAMUSCULAR | 1 refills | Status: DC

## 2017-11-18 NOTE — Telephone Encounter (Signed)
Left message to let pt know that we have resent the rx for her b12 injections stating that she can take them every 14 days.

## 2017-11-20 NOTE — Progress Notes (Signed)
  I have reviewed the above information and agree with above.   Darrol Brandenburg, MD 

## 2017-11-29 ENCOUNTER — Telehealth: Payer: Self-pay

## 2017-11-29 NOTE — Telephone Encounter (Signed)
PA for Contrave has been submitted on covermymeds.  

## 2017-11-30 ENCOUNTER — Telehealth: Payer: Self-pay | Admitting: Internal Medicine

## 2017-11-30 NOTE — Telephone Encounter (Signed)
Copied from [place] [phone]. Topic: Quick Communication - Rx Refill/Question >> Nov 30, 2017  1:11 PM Neva Seat wrote: Minette [place] 8-90 mg  [facility] w/ [place] [facility] [place] [phone] Faxing the info to office - prior authorization

## 2017-11-30 NOTE — Telephone Encounter (Signed)
Please advise 

## 2017-12-06 NOTE — Telephone Encounter (Signed)
LMTCB with [place] at [place]. Please transfer to our office.

## 2017-12-14 NOTE — Telephone Encounter (Signed)
Spoke with [employer] and answered some clinical questions for the [place] PA. Still waiting on determination of coverage.

## 2018-01-05 DIAGNOSIS — E669 Obesity, unspecified: Secondary | ICD-10-CM

## 2018-01-05 NOTE — Telephone Encounter (Signed)
PA for Contrave has been approved from 12/13/2017 - 06/10/2018. Pt and pharmacy are aware.

## 2018-01-30 ENCOUNTER — Encounter: Payer: Self-pay | Admitting: Internal Medicine

## 2018-01-30 DIAGNOSIS — Z1231 Encounter for screening mammogram for malignant neoplasm of breast: Secondary | ICD-10-CM | POA: Diagnosis not present

## 2018-01-30 LAB — HM MAMMOGRAPHY

## 2018-01-31 ENCOUNTER — Telehealth: Payer: Self-pay

## 2018-01-31 NOTE — Telephone Encounter (Signed)
Copied from [place] [phone]. Topic: General - Other >> Jan 31, 2018 10:38 AM Ahmed Prima L wrote: Patient called and said she received a bill for $29.00 for 8/15, patient said she was not even in the office on 8/15. She already contacted billing and they told her to contact the office.

## 2018-01-31 NOTE — Telephone Encounter (Signed)
The charge was for filling out a prior authorization for Contrave.

## 2018-04-03 NOTE — Telephone Encounter (Signed)
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 A copy of this new report will be sent to get scanned and was made available to her.   Steele Berg, MS, [place] Certified Genetic Counselor phone: [phone] [email or web address]

## 2018-07-05 DIAGNOSIS — Z09 Encounter for follow-up examination after completed treatment for conditions other than malignant neoplasm: Secondary | ICD-10-CM | POA: Diagnosis not present

## 2018-07-05 DIAGNOSIS — B351 Tinea unguium: Secondary | ICD-10-CM | POA: Diagnosis not present

## 2018-07-05 DIAGNOSIS — Z85828 Personal history of other malignant neoplasm of skin: Secondary | ICD-10-CM | POA: Diagnosis not present

## 2018-07-05 DIAGNOSIS — L853 Xerosis cutis: Secondary | ICD-10-CM | POA: Diagnosis not present

## 2018-07-05 DIAGNOSIS — L601 Onycholysis: Secondary | ICD-10-CM | POA: Diagnosis not present

## 2018-07-05 DIAGNOSIS — L718 Other rosacea: Secondary | ICD-10-CM | POA: Diagnosis not present

## 2018-08-28 ENCOUNTER — Encounter: Payer: Self-pay | Admitting: Internal Medicine

## 2018-09-14 ENCOUNTER — Encounter: Payer: Self-pay | Admitting: Oncology

## 2018-10-17 ENCOUNTER — Other Ambulatory Visit: Payer: Self-pay | Admitting: Internal Medicine

## 2018-10-30 ENCOUNTER — Encounter: Payer: Self-pay | Admitting: Internal Medicine

## 2018-11-06 ENCOUNTER — Encounter: Payer: Self-pay | Admitting: Internal Medicine

## 2018-12-07 ENCOUNTER — Other Ambulatory Visit: Payer: Self-pay

## 2018-12-11 ENCOUNTER — Other Ambulatory Visit (HOSPITAL_COMMUNITY)
Admission: RE | Admit: 2018-12-11 | Discharge: 2018-12-11 | Disposition: A | Discharge status: Home / Self Care | Payer: BC Managed Care – PPO | Source: Ambulatory Visit | Attending: Internal Medicine | Admitting: Internal Medicine

## 2018-12-11 ENCOUNTER — Encounter: Payer: Self-pay | Admitting: Internal Medicine

## 2018-12-11 ENCOUNTER — Other Ambulatory Visit: Payer: Self-pay

## 2018-12-11 ENCOUNTER — Ambulatory Visit (INDEPENDENT_AMBULATORY_CARE_PROVIDER_SITE_OTHER): Payer: BC Managed Care – PPO | Admitting: Internal Medicine

## 2018-12-11 VITALS — BP 104/78 | HR 60 | Temp 98.1°F | Resp 14 | Ht 68.0 in | Wt 188.8 lb

## 2018-12-11 DIAGNOSIS — R5382 Chronic fatigue, unspecified: Secondary | ICD-10-CM

## 2018-12-11 DIAGNOSIS — F5105 Insomnia due to other mental disorder: Secondary | ICD-10-CM

## 2018-12-11 DIAGNOSIS — F419 Anxiety disorder, unspecified: Secondary | ICD-10-CM

## 2018-12-11 DIAGNOSIS — E559 Vitamin D deficiency, unspecified: Secondary | ICD-10-CM | POA: Diagnosis not present

## 2018-12-11 DIAGNOSIS — Z124 Encounter for screening for malignant neoplasm of cervix: Secondary | ICD-10-CM | POA: Insufficient documentation

## 2018-12-11 DIAGNOSIS — Z809 Family history of malignant neoplasm, unspecified: Secondary | ICD-10-CM

## 2018-12-11 DIAGNOSIS — R05 Cough: Secondary | ICD-10-CM | POA: Diagnosis not present

## 2018-12-11 DIAGNOSIS — Z1239 Encounter for other screening for malignant neoplasm of breast: Secondary | ICD-10-CM

## 2018-12-11 DIAGNOSIS — Z0000 Encounter for general adult medical examination without abnormal findings: Secondary | ICD-10-CM

## 2018-12-11 DIAGNOSIS — R059 Cough, unspecified: Secondary | ICD-10-CM

## 2018-12-11 DIAGNOSIS — E538 Deficiency of other specified B group vitamins: Secondary | ICD-10-CM

## 2018-12-11 DIAGNOSIS — E663 Overweight: Secondary | ICD-10-CM

## 2018-12-11 MED ORDER — TRAZODONE HCL 50 MG PO TABS: 50.0000 mg | ORAL_TABLET | Freq: Every evening | ORAL | 3 refills | Status: DC | PRN

## 2018-12-11 NOTE — Progress Notes (Signed)
Patient ID: Alexis Little, female    DOB: [birth year]  Age: 43 y.o. MRN: [id number]  The patient is here for annual preventive examination and management of other chronic and acute problems including:   Vitamin D, b12  deficiency genetic testing done by [facility]  Mammogram due and is  done at [facility] Overweight:  Weight loss intentional Occasional insomnia  Trazodone requested    The risk factors are reflected in the social history.  The roster of all physicians providing medical care to patient - is listed in the Snapshot section of the chart.  Activities of daily living:  The patient is 100% independent in all ADLs: dressing, toileting, feeding as well as independent mobility  Home safety : The patient has smoke detectors in the home. They wear seatbelts.  There are no firearms at home. There is no violence in the home.   There is no risks for hepatitis, STDs or HIV. There is no   history of blood transfusion. They have no travel history to infectious disease endemic areas of the world.  The patient has seen their dentist in the last six month. They have seen their eye doctor in the last year. .  They do  have excessive sun exposure but she does use hats, sunscreen if there is significant sun exposure.   Diet: the importance of a healthy diet is discussed. They do have a healthy diet.  The benefits of regular aerobic exercise were discussed. She walks 4 times per week ,  20 minutes.   Depression screen: there are no signs or vegative symptoms of depression- irritability, change in appetite, anhedonia, sadness/tearfullness.  Cognitive assessment: the patient manages all their financial and personal affairs and is actively engaged. They could relate day,date,year and events; recalled 2/3 objects at 3 minutes; performed clock-face test normally.  The following portions of the patient's history were reviewed and updated as appropriate: allergies, current medications, past family history, past  medical history,  past surgical history, past social history  and problem list.  Visual acuity was not assessed per patient preference since she has regular follow up with her ophthalmologist. Hearing and body mass index were assessed and reviewed.   During the course of the visit the patient was educated and counseled about appropriate screening and preventive services including : fall prevention , diabetes screening, nutrition counseling, colorectal cancer screening, and recommended immunizations.    CC: The primary encounter diagnosis was Breast cancer screening. Diagnoses of Cervical cancer screening, Vitamin D deficiency, Cough in adult, Chronic fatigue, Encounter for preventive health examination, Screening for breast cancer, Insomnia secondary to anxiety, B12 deficiency, Overweight (BMI 25.0-29.9), Paternal family history of cancer, and Routine general medical examination at a health care facility were also pertinent to this visit.  Vitamin D, b12  Deficiency:  Takes 10,000 ius of Vitamin d weekly,  And monthly injections of B12  genetic testing done by [facility] . Mammogram due and is  done at [facility] per Dr Dwyane Luo preference  Overweight:  Weight loss has been intentional.  Her goal weight is 165 lbs.  Occasional insomnia  :  She borrowed Trazodone from her husband on occasion  And is sleeping much better .  She has requested her own prescription .  Reviewed principles of good sleep hygiene   History Alexis Little has a past medical history of History of kidney stones, Hyperlipidemia, and Squamous cell carcinoma, arm.   She has a past surgical history that includes Kidney stone surgery; Kidney stone surgery;  and Vaginal delivery.   Her family history includes Cancer in an other family member; Cancer (age of onset: 62) in her cousin; Colon cancer (age of onset: 77) in her paternal uncle; Esophageal cancer (age of onset: 31) in her father; Heart disease in her father; Lung cancer (age of onset: 56)  in her paternal grandfather; Pancreatic cancer (age of onset: 70) in an other family member; Prostate cancer (age of onset: 27) in her paternal uncle; Throat cancer in her paternal uncle.She reports that she has never smoked. She has never used smokeless tobacco. She reports that she does not drink alcohol or use drugs.  Outpatient Medications Prior to Visit  Medication Sig Dispense Refill  . cyanocobalamin (,VITAMIN B-12,) 1000 MCG/ML injection INJECT 1 ML (1,000 MCG TOTAL) INTO THE MUSCLE EVERY 14 DAYS 10 mL 1  . Syringe/Needle, Disp, (SYRINGE 3CC/25GX1") 25G X 1" 3 ML MISC Use for b12 injections 50 each 0  . ergocalciferol (DRISDOL) 50000 units capsule Take 1 capsule (50,000 Units total) by mouth once a week. (Patient not taking: Reported on 12/11/2018) 4 capsule 2  . Naltrexone-buPROPion HCl ER 8-90 MG TB12 One tablet every morning for one week, then twice daily for one week.. Increase gradually to 2 tablets twice daily (Patient not taking: Reported on 12/11/2018) 120 tablet 0   No facility-administered medications prior to visit.     Review of Systems   Patient denies headache, fevers, malaise, unintentional weight loss, skin rash, eye pain, sinus congestion and sinus pain, sore throat, dysphagia,  hemoptysis , cough, dyspnea, wheezing, chest pain, palpitations, orthopnea, edema, abdominal pain, nausea, melena, diarrhea, constipation, flank pain, dysuria, hematuria, urinary  Frequency, nocturia, numbness, tingling, seizures,  Focal weakness, Loss of consciousness,  Tremor, insomnia, depression, anxiety, and suicidal ideation.      Objective:  BP 104/78 (BP Location: Left Arm, Patient Position: Sitting, Cuff Size: Large)   Pulse 60   Temp 98.1 F (36.7 C) (Oral)   Resp 14   Ht 5\' 8"  (1.727 m)   Wt 188 lb 12.8 oz (85.6 kg)   SpO2 99%   BMI 28.71 kg/m   Physical Exam  General Appearance:    Alert, cooperative, no distress, appears stated age  Head:    Normocephalic, without obvious  abnormality, atraumatic  Eyes:    PERRL, conjunctiva/corneas clear, EOM's intact, fundi    benign, both eyes  Ears:    Normal TM's and external ear canals, both ears  Nose:   Nares normal, septum midline, mucosa normal, no drainage    or sinus tenderness  Throat:   Lips, mucosa, and tongue normal; teeth and gums normal  Neck:   Supple, symmetrical, trachea midline, no adenopathy;    thyroid:  no enlargement/tenderness/nodules; no carotid   bruit or JVD  Back:     Symmetric, no curvature, ROM normal, no CVA tenderness  Lungs:     Clear to auscultation bilaterally, respirations unlabored  Chest Wall:    No tenderness or deformity   Heart:    Regular rate and rhythm, S1 and S2 normal, no murmur, rub   or gallop  Breast Exam:    No tenderness, masses, or nipple abnormality  Abdomen:     Soft, non-tender, bowel sounds active all four quadrants,    no masses, no organomegaly  Genitalia:    Pelvic: cervix normal in appearance, external genitalia normal, no adnexal masses or tenderness, no cervical motion tenderness, rectovaginal septum normal, uterus normal size, shape, and consistency and vagina  normal without discharge  Extremities:   Extremities normal, atraumatic, no cyanosis or edema  Pulses:   2+ and symmetric all extremities  Skin:   Skin color, texture, turgor normal, no rashes or lesions  Lymph nodes:   Cervical, supraclavicular, and axillary nodes normal  Neurologic:   CNII-XII intact, normal strength, sensation and reflexes    throughout    Assessment & Plan:   Problem List Items Addressed This Visit      Unprioritized   Chronic fatigue   Relevant Orders   Comprehensive metabolic panel   TSH   Vitamin D deficiency    Managed with periodic weekly intake of 10,00) Ius.        Relevant Orders   VITAMIN D 25 Hydroxy (Vit-D Deficiency, Fractures)   Screening for breast cancer    She was referred for genetic testing .  Results were negative for any commonly seen mutations.   Continue annual screening mammograms at [facility]      Routine general medical examination at a health care facility    age appropriate education and counseling updated, referrals for preventative services and immunizations addressed, dietary and smoking counseling addressed, most recent labs reviewed.  I have personally reviewed and have noted:  1) the patient's medical and social history 2) The pt's use of alcohol, tobacco, and illicit drugs 3) The patient's current medications and supplements 4) Functional ability including ADL's, fall risk, home safety risk, hearing and visual impairment 5) Diet and physical activities 6) Evidence for depression or mood disorder 7) The patient's height, weight, and BMI have been recorded in the chart  I have made referrals, and provided counseling and education based on review of the above      Paternal family history of cancer    She was referred for genetic testing .  Results were negative for any commonly seen mutations      Overweight (BMI 25.0-29.9)    I have congratulated her in reduction of   BMI and encouraged  Continued weight loss with goal of 10% of body weigh over the next 6 months using a low glycemic index diet and regular exercise a minimum of 5 days per week.        Insomnia secondary to anxiety    Chronic, with no improvement using over-the-counter first generation antihistamines. Reviewed principles of good sleep hygiene. Trazodone prescribed today .       Relevant Medications   traZODone (DESYREL) 50 MG tablet   RESOLVED: Cough in adult   B12 deficiency    Managed with monthly b12 injections        Other Visit Diagnoses    Breast cancer screening    -  Primary   Relevant Orders   MM DIGITAL SCREENING BILATERAL   Cervical cancer screening       Relevant Orders   Cytology - PAP( [place])   Encounter for preventive health examination       Relevant Orders   Lipid panel      I have discontinued Alexis Little. Alexis Little  "Danielle"'s ergocalciferol and Naltrexone-buPROPion HCl ER. I am also having her start on traZODone. Additionally, I am having her maintain her SYRINGE 3CC/25GX1" and cyanocobalamin.  Meds ordered this encounter  Medications  . traZODone (DESYREL) 50 MG tablet    Sig: Take 1-2 tablets (50-100 mg total) by mouth at bedtime as needed for sleep.    Dispense:  60 tablet    Refill:  3    Medications Discontinued  During This Encounter  Medication Reason  . ergocalciferol (DRISDOL) 50000 units capsule Completed Course  . Naltrexone-buPROPion HCl ER 8-90 MG TB12 Patient has not taken in last 30 days    Follow-up: No follow-ups on file.   Crecencio Mc, MD

## 2018-12-11 NOTE — Patient Instructions (Signed)
Congratulations on the weight loss!!   Health Maintenance, Female Adopting a healthy lifestyle and getting preventive care are important in promoting health and wellness. Ask your health care provider about:  The right schedule for you to have regular tests and exams.  Things you can do on your own to prevent diseases and keep yourself healthy. What should I know about diet, weight, and exercise? Eat a healthy diet   Eat a diet that includes plenty of vegetables, fruits, low-fat dairy products, and lean protein.  Do not eat a lot of foods that are high in solid fats, added sugars, or sodium. Maintain a healthy weight Body mass index (BMI) is used to identify weight problems. It estimates body fat based on height and weight. Your health care provider can help determine your BMI and help you achieve or maintain a healthy weight. Get regular exercise Get regular exercise. This is one of the most important things you can do for your health. Most adults should:  Exercise for at least 150 minutes each week. The exercise should increase your heart rate and make you sweat (moderate-intensity exercise).  Do strengthening exercises at least twice a week. This is in addition to the moderate-intensity exercise.  Spend less time sitting. Even light physical activity can be beneficial. Watch cholesterol and blood lipids Have your blood tested for lipids and cholesterol at 45 years of age, then have this test every 5 years. Have your cholesterol levels checked more often if:  Your lipid or cholesterol levels are high.  You are older than 45 years of age.  You are at high risk for heart disease. What should I know about cancer screening? Depending on your health history and family history, you may need to have cancer screening at various ages. This may include screening for:  Breast cancer.  Cervical cancer.  Colorectal cancer.  Skin cancer.  Lung cancer. What should I know about heart  disease, diabetes, and high blood pressure? Blood pressure and heart disease  High blood pressure causes heart disease and increases the risk of stroke. This is more likely to develop in people who have high blood pressure readings, are of African descent, or are overweight.  Have your blood pressure checked: ? Every 3-5 years if you are 39-71 years of age. ? Every year if you are 65 years old or older. Diabetes Have regular diabetes screenings. This checks your fasting blood sugar level. Have the screening done:  Once every three years after age 41 if you are at a normal weight and have a low risk for diabetes.  More often and at a younger age if you are overweight or have a high risk for diabetes. What should I know about preventing infection? Hepatitis B If you have a higher risk for hepatitis B, you should be screened for this virus. Talk with your health care provider to find out if you are at risk for hepatitis B infection. Hepatitis C Testing is recommended for:  Everyone born from [birth year] through 1967.  Anyone with known risk factors for hepatitis C. Sexually transmitted infections (STIs)  Get screened for STIs, including gonorrhea and chlamydia, if: ? You are sexually active and are younger than 45 years of age. ? You are older than 45 years of age and your health care provider tells you that you are at risk for this type of infection. ? Your sexual activity has changed since you were last screened, and you are at increased risk for chlamydia or  gonorrhea. Ask your health care provider if you are at risk.  Ask your health care provider about whether you are at high risk for HIV. Your health care provider may recommend a prescription medicine to help prevent HIV infection. If you choose to take medicine to prevent HIV, you should first get tested for HIV. You should then be tested every 3 months for as long as you are taking the medicine. Pregnancy  If you are about to stop  having your period (premenopausal) and you may become pregnant, seek counseling before you get pregnant.  Take 400 to 800 micrograms (mcg) of folic acid every day if you become pregnant.  Ask for birth control (contraception) if you want to prevent pregnancy. Osteoporosis and menopause Osteoporosis is a disease in which the bones lose minerals and strength with aging. This can result in bone fractures. If you are 80 years old or older, or if you are at risk for osteoporosis and fractures, ask your health care provider if you should:  Be screened for bone loss.  Take a calcium or vitamin D supplement to lower your risk of fractures.  Be given hormone replacement therapy (HRT) to treat symptoms of menopause. Follow these instructions at home: Lifestyle  Do not use any products that contain nicotine or tobacco, such as cigarettes, e-cigarettes, and chewing tobacco. If you need help quitting, ask your health care provider.  Do not use street drugs.  Do not share needles.  Ask your health care provider for help if you need support or information about quitting drugs. Alcohol use  Do not drink alcohol if: ? Your health care provider tells you not to drink. ? You are pregnant, may be pregnant, or are planning to become pregnant.  If you drink alcohol: ? Limit how much you use to 0-1 drink a day. ? Limit intake if you are breastfeeding.  Be aware of how much alcohol is in your drink. In the [place] one drink equals one 12 oz bottle of beer (355 mL), one 5 oz glass of wine (148 mL), or one 1 oz glass of hard liquor (44 mL). General instructions  Schedule regular health, dental, and eye exams.  Stay current with your vaccines.  Tell your health care provider if: ? You often feel depressed. ? You have ever been abused or do not feel safe at home. Summary  Adopting a healthy lifestyle and getting preventive care are important in promoting health and wellness.  Follow your health  care provider's instructions about healthy diet, exercising, and getting tested or screened for diseases.  Follow your health care provider's instructions on monitoring your cholesterol and blood pressure. This information is not intended to replace advice given to you by your health care provider. Make sure you discuss any questions you have with your health care provider. Document Released: 11/23/2010 Document Revised: 05/03/2018 Document Reviewed: 05/03/2018 Elsevier Patient Education  2020 [employer].

## 2018-12-12 LAB — COMPREHENSIVE METABOLIC PANEL
ALT: 46 U/L — ABNORMAL HIGH (ref 0–35)
AST: 49 U/L — ABNORMAL HIGH (ref 0–37)
Albumin: 4.3 g/dL (ref 3.5–5.2)
Alkaline Phosphatase: 53 U/L (ref 39–117)
BUN: 17 mg/dL (ref 6–23)
CO2: 26 mEq/L (ref 19–32)
Calcium: 9.7 mg/dL (ref 8.4–10.5)
Chloride: 103 mEq/L (ref 96–112)
Creatinine, Ser: 0.88 mg/dL (ref 0.40–1.20)
GFR: 69.6 mL/min (ref >60.00)
Glucose, Bld: 77 mg/dL (ref 70–99)
Potassium: 4.3 mEq/L (ref 3.5–5.1)
Sodium: 139 mEq/L (ref 135–145)
Total Bilirubin: 0.4 mg/dL (ref 0.2–1.2)
Total Protein: 6.3 g/dL (ref 6.0–8.3)

## 2018-12-12 LAB — LIPID PANEL
Cholesterol: 194 mg/dL (ref 0–200)
HDL: 55.6 mg/dL (ref >39.00)
LDL Cholesterol: 118 mg/dL — ABNORMAL HIGH (ref 0–99)
NonHDL: 138.14
Total CHOL/HDL Ratio: 3
Triglycerides: 100 mg/dL (ref 0.0–149.0)
VLDL: 20 mg/dL (ref 0.0–40.0)

## 2018-12-12 LAB — TSH: TSH: 1.29 u[IU]/mL (ref 0.35–4.50)

## 2018-12-12 LAB — VITAMIN D 25 HYDROXY (VIT D DEFICIENCY, FRACTURES): VITD: 41.42 ng/mL (ref 30.00–100.00)

## 2018-12-12 NOTE — Assessment & Plan Note (Signed)
I have congratulated her in reduction of   BMI and encouraged  Continued weight loss with goal of 10% of body weigh over the next 6 months using a low glycemic index diet and regular exercise a minimum of 5 days per week.    

## 2018-12-12 NOTE — Assessment & Plan Note (Signed)
She was referred for genetic testing .  Results were negative for any commonly seen mutations

## 2018-12-12 NOTE — Assessment & Plan Note (Signed)
Managed with monthly b12 injections   

## 2018-12-12 NOTE — Assessment & Plan Note (Addendum)
Chronic, with no improvement using over-the-counter first generation antihistamines. Reviewed principles of good sleep hygiene. Trazodone prescribed today .

## 2018-12-12 NOTE — Assessment & Plan Note (Signed)

## 2018-12-12 NOTE — Assessment & Plan Note (Signed)
She was referred for genetic testing .  Results were negative for any commonly seen mutations.  Continue annual screening mammograms at [facility]

## 2018-12-12 NOTE — Assessment & Plan Note (Signed)
Managed with periodic weekly intake of 10,00) Ius.

## 2018-12-13 LAB — CYTOLOGY - PAP: Diagnosis: NEGATIVE

## 2018-12-14 ENCOUNTER — Other Ambulatory Visit: Payer: Self-pay | Admitting: Internal Medicine

## 2018-12-14 DIAGNOSIS — R748 Abnormal levels of other serum enzymes: Secondary | ICD-10-CM | POA: Insufficient documentation

## 2018-12-14 NOTE — Progress Notes (Signed)
Hepatic panel ordered

## 2018-12-14 NOTE — Assessment & Plan Note (Signed)
Repeat in 2 weeks. AST>ALT

## 2018-12-27 ENCOUNTER — Other Ambulatory Visit (INDEPENDENT_AMBULATORY_CARE_PROVIDER_SITE_OTHER): Payer: BC Managed Care – PPO

## 2018-12-27 ENCOUNTER — Other Ambulatory Visit: Payer: Self-pay

## 2018-12-27 DIAGNOSIS — R748 Abnormal levels of other serum enzymes: Secondary | ICD-10-CM

## 2018-12-27 LAB — HEPATIC FUNCTION PANEL
ALT: 12 U/L (ref 0–35)
AST: 13 U/L (ref 0–37)
Albumin: 4.3 g/dL (ref 3.5–5.2)
Alkaline Phosphatase: 56 U/L (ref 39–117)
Bilirubin, Direct: 0.1 mg/dL (ref 0.0–0.3)
Total Bilirubin: 0.5 mg/dL (ref 0.2–1.2)
Total Protein: 6.4 g/dL (ref 6.0–8.3)

## 2019-03-05 DIAGNOSIS — Z1231 Encounter for screening mammogram for malignant neoplasm of breast: Secondary | ICD-10-CM | POA: Diagnosis not present

## 2019-03-05 LAB — HM MAMMOGRAPHY

## 2019-04-25 DIAGNOSIS — R635 Abnormal weight gain: Secondary | ICD-10-CM | POA: Diagnosis not present

## 2019-04-25 DIAGNOSIS — N951 Menopausal and female climacteric states: Secondary | ICD-10-CM | POA: Diagnosis not present

## 2019-04-25 DIAGNOSIS — E782 Mixed hyperlipidemia: Secondary | ICD-10-CM | POA: Diagnosis not present

## 2019-04-30 DIAGNOSIS — E559 Vitamin D deficiency, unspecified: Secondary | ICD-10-CM | POA: Diagnosis not present

## 2019-04-30 DIAGNOSIS — R635 Abnormal weight gain: Secondary | ICD-10-CM | POA: Diagnosis not present

## 2019-04-30 DIAGNOSIS — Z1331 Encounter for screening for depression: Secondary | ICD-10-CM | POA: Diagnosis not present

## 2019-04-30 DIAGNOSIS — E538 Deficiency of other specified B group vitamins: Secondary | ICD-10-CM | POA: Diagnosis not present

## 2019-04-30 DIAGNOSIS — N951 Menopausal and female climacteric states: Secondary | ICD-10-CM | POA: Diagnosis not present

## 2019-05-09 DIAGNOSIS — G479 Sleep disorder, unspecified: Secondary | ICD-10-CM | POA: Diagnosis not present

## 2019-05-09 DIAGNOSIS — N951 Menopausal and female climacteric states: Secondary | ICD-10-CM | POA: Diagnosis not present

## 2019-05-09 DIAGNOSIS — R6882 Decreased libido: Secondary | ICD-10-CM | POA: Diagnosis not present

## 2019-05-15 DIAGNOSIS — L700 Acne vulgaris: Secondary | ICD-10-CM | POA: Diagnosis not present

## 2019-05-30 DIAGNOSIS — E782 Mixed hyperlipidemia: Secondary | ICD-10-CM | POA: Diagnosis not present

## 2019-05-30 DIAGNOSIS — Z6830 Body mass index (BMI) 30.0-30.9, adult: Secondary | ICD-10-CM | POA: Diagnosis not present

## 2019-06-06 DIAGNOSIS — R5383 Other fatigue: Secondary | ICD-10-CM | POA: Diagnosis not present

## 2019-06-06 DIAGNOSIS — N951 Menopausal and female climacteric states: Secondary | ICD-10-CM | POA: Diagnosis not present

## 2019-06-06 DIAGNOSIS — E559 Vitamin D deficiency, unspecified: Secondary | ICD-10-CM | POA: Diagnosis not present

## 2019-06-06 DIAGNOSIS — Z6829 Body mass index (BMI) 29.0-29.9, adult: Secondary | ICD-10-CM | POA: Diagnosis not present

## 2019-06-06 DIAGNOSIS — G479 Sleep disorder, unspecified: Secondary | ICD-10-CM | POA: Diagnosis not present

## 2019-06-06 DIAGNOSIS — R6882 Decreased libido: Secondary | ICD-10-CM | POA: Diagnosis not present

## 2019-06-13 DIAGNOSIS — E559 Vitamin D deficiency, unspecified: Secondary | ICD-10-CM | POA: Diagnosis not present

## 2019-06-13 DIAGNOSIS — Z6829 Body mass index (BMI) 29.0-29.9, adult: Secondary | ICD-10-CM | POA: Diagnosis not present

## 2019-06-13 DIAGNOSIS — R6882 Decreased libido: Secondary | ICD-10-CM | POA: Diagnosis not present

## 2019-06-13 DIAGNOSIS — N951 Menopausal and female climacteric states: Secondary | ICD-10-CM | POA: Diagnosis not present

## 2019-06-28 DIAGNOSIS — Z6829 Body mass index (BMI) 29.0-29.9, adult: Secondary | ICD-10-CM | POA: Diagnosis not present

## 2019-06-28 DIAGNOSIS — E782 Mixed hyperlipidemia: Secondary | ICD-10-CM | POA: Diagnosis not present

## 2019-07-09 DIAGNOSIS — D2261 Melanocytic nevi of right upper limb, including shoulder: Secondary | ICD-10-CM | POA: Diagnosis not present

## 2019-07-09 DIAGNOSIS — L700 Acne vulgaris: Secondary | ICD-10-CM | POA: Diagnosis not present

## 2019-07-09 DIAGNOSIS — D2271 Melanocytic nevi of right lower limb, including hip: Secondary | ICD-10-CM | POA: Diagnosis not present

## 2019-07-09 DIAGNOSIS — D225 Melanocytic nevi of trunk: Secondary | ICD-10-CM | POA: Diagnosis not present

## 2019-07-09 DIAGNOSIS — D485 Neoplasm of uncertain behavior of skin: Secondary | ICD-10-CM | POA: Diagnosis not present

## 2019-07-09 DIAGNOSIS — D2272 Melanocytic nevi of left lower limb, including hip: Secondary | ICD-10-CM | POA: Diagnosis not present

## 2019-07-09 DIAGNOSIS — D2262 Melanocytic nevi of left upper limb, including shoulder: Secondary | ICD-10-CM | POA: Diagnosis not present

## 2019-07-18 DIAGNOSIS — Z6829 Body mass index (BMI) 29.0-29.9, adult: Secondary | ICD-10-CM | POA: Diagnosis not present

## 2019-07-18 DIAGNOSIS — E782 Mixed hyperlipidemia: Secondary | ICD-10-CM | POA: Diagnosis not present

## 2019-07-18 DIAGNOSIS — G479 Sleep disorder, unspecified: Secondary | ICD-10-CM | POA: Diagnosis not present

## 2019-07-18 DIAGNOSIS — E559 Vitamin D deficiency, unspecified: Secondary | ICD-10-CM | POA: Diagnosis not present

## 2019-07-24 ENCOUNTER — Telehealth: Payer: Self-pay | Admitting: Internal Medicine

## 2019-07-24 MED ORDER — VALACYCLOVIR HCL 1 G PO TABS: 1000.0000 mg | ORAL_TABLET | Freq: Two times a day (BID) | ORAL | 0 refills | Status: DC

## 2019-07-24 NOTE — Telephone Encounter (Signed)
Yes,  She needs OV bc I have no history of herpes in chart and no history of ever prescribing this for her

## 2019-07-24 NOTE — Telephone Encounter (Signed)
Spoke with pt scheduled her an appt. Pt stated that she is not having a current breakout but has been having them more frequent over the last year. She stated that she has a breakout about once a month verus her usually once a year break out. Pt stated that she was diagnosed with herpes at the age of 53 and she has been using essential oils to treat it. She stated that when she breaks out it is located at the top of her buttocks.

## 2019-07-24 NOTE — Telephone Encounter (Signed)
Pt is having frequent flare ups-She would like a rx of Valtrex. Please let her know if she needs an appt because she has not had this filled in years.

## 2019-07-24 NOTE — Telephone Encounter (Signed)
That's an odd place to have a herpes outbreak,  Usually it is on the lips, genitals (Places with a mucous membrane ) . Ask her to send a picture (or take one sicne seh doe snot have mychart)  if she has another outbreak before her appointment and I have sent  in a treatment dose refill just in case she has one

## 2019-07-25 NOTE — Telephone Encounter (Signed)
Spoke with pt and advised her of Dr. Lupita Dawn message below and that she sent in a treatment dose for pt to take if she has an outbreak before the appt. Pt gave a verbal understanding. Pt stated that the reason it's not in her chart with [place] is because she used to go to [facility]. Pt gave verbal consent to request the records. Records have been requested.

## 2019-08-01 DIAGNOSIS — Z6829 Body mass index (BMI) 29.0-29.9, adult: Secondary | ICD-10-CM | POA: Diagnosis not present

## 2019-08-01 DIAGNOSIS — E782 Mixed hyperlipidemia: Secondary | ICD-10-CM | POA: Diagnosis not present

## 2019-08-02 ENCOUNTER — Encounter: Payer: Self-pay | Admitting: Internal Medicine

## 2019-08-02 ENCOUNTER — Telehealth (INDEPENDENT_AMBULATORY_CARE_PROVIDER_SITE_OTHER): Payer: Self-pay | Admitting: Internal Medicine

## 2019-08-02 VITALS — Ht 68.0 in | Wt 184.6 lb

## 2019-08-02 DIAGNOSIS — R748 Abnormal levels of other serum enzymes: Secondary | ICD-10-CM

## 2019-08-02 DIAGNOSIS — E538 Deficiency of other specified B group vitamins: Secondary | ICD-10-CM

## 2019-08-02 DIAGNOSIS — R238 Other skin changes: Secondary | ICD-10-CM

## 2019-08-02 DIAGNOSIS — E782 Mixed hyperlipidemia: Secondary | ICD-10-CM

## 2019-08-02 MED ORDER — VALACYCLOVIR HCL 1 G PO TABS: 1000.0000 mg | ORAL_TABLET | Freq: Every day | ORAL | 3 refills | Status: DC

## 2019-08-02 NOTE — Progress Notes (Signed)
Virtual Visit via caregility Note  This visit type was conducted due to national recommendations for restrictions regarding the COVID-19 pandemic (e.g. social distancing).  This format is felt to be most appropriate for this patient at this time.  All issues noted in this document were discussed and addressed.  No physical exam was performed (except for noted visual exam findings with Video Visits).   I connected with@ on 08/02/19 at 11:30 AM EST by a video enabled telemedicine application and verified that I am speaking with the correct person using two identifiers. Location patient: home Location provider: work or home office Persons participating in the virtual visit: patient, provider  I discussed the limitations, risks, security and privacy concerns of performing an evaluation and management service by telephone and the availability of in person appointments. I also discussed with the patient that there may be a patient responsible charge related to this service. The patient expressed understanding and agreed to proceed. \ Reason for visit: follow up  HPI:  44 yr old female presents with history of recurrent vesicular pruritic rash that occurs on buttock, (unilateral). Has been previously attributed to herpes simplex  Which may have been diagnosed at age 71 with a T[place]ck smear, although patient cannot recall with certainty how the diagnosis was made and records unavailable.  She does recall that she was  placed on suppressive therapy perinatally for the birth of son.  Subsequent episodes have been treated with valcyclovir.  The episodes are occurring almost monthly and she is concerned about spreading the infection to her husband.  She would like supressive therapy.   ROS: See pertinent positives and negatives per HPI.  Past Medical History:  Diagnosis Date  . History of kidney stones   . Hyperlipidemia   . Squamous cell carcinoma, arm    Followed by Dr. Evorn Gong    Past Surgical  History:  Procedure Laterality Date  . KIDNEY STONE SURGERY    . KIDNEY STONE SURGERY     surgical removal, Dr. Pearlie Oyster age 34  . VAGINAL DELIVERY     2    Family History  Problem Relation Age of Onset  . Heart disease Father        AFIB  . Esophageal cancer Father 77       widely metastatic; smoker; deceased 61  . Lung cancer Paternal Grandfather 53       smoker; deceased 50  . Colon cancer Paternal Uncle 70       currently 86  . Prostate cancer Paternal Uncle 4       currently 27  . Throat cancer Paternal Uncle        non-smoker; currently 40  . Cancer Cousin 18       in jaw; daughter of pat uncle with throad ca  . Pancreatic cancer Other 40       pat grandfather's sister; non-smoker; deceased 41s  . Cancer Other        lung and larynx ca in 2 other sisters of pat grandfather    SOCIAL HX:  reports that she has never smoked. She has never used smokeless tobacco. She reports that she does not drink alcohol or use drugs.   Current Outpatient Medications:  .  cyanocobalamin (,VITAMIN B-12,) 1000 MCG/ML injection, INJECT 1 ML (1,000 MCG TOTAL) INTO THE MUSCLE EVERY 14 DAYS, Disp: 10 mL, Rfl: 1 .  Syringe/Needle, Disp, (SYRINGE 3CC/25GX1") 25G X 1" 3 ML MISC, Use for b12 injections, Disp: 50 each, Rfl:  0 .  valACYclovir (VALTREX) 1000 MG tablet, Take 1 tablet (1,000 mg total) by mouth daily., Disp: 90 tablet, Rfl: 3 .  traZODone (DESYREL) 50 MG tablet, Take 1-2 tablets (50-100 mg total) by mouth at bedtime as needed for sleep. (Patient not taking: Reported on 08/02/2019), Disp: 60 tablet, Rfl: 3  EXAM:  VITALS per patient if applicable:  GENERAL: alert, oriented, appears well and in no acute distress  HEENT: atraumatic, conjunttiva clear, no obvious abnormalities on inspection of external nose and ears  NECK: normal movements of the head and neck  LUNGS: on inspection no signs of respiratory distress, breathing rate appears normal, no obvious gross SOB, gasping or  wheezing  CV: no obvious cyanosis  MS: moves all visible extremities without noticeable abnormality  PSYCH/NEURO: pleasant and cooperative, no obvious depression or anxiety, speech and thought processing grossly intact  ASSESSMENT AND PLAN:  Discussed the following assessment and plan:  B12 deficiency - Plan: Vitamin B12  Elevated liver enzymes - Plan: Comprehensive metabolic panel, TSH  Moderate mixed hyperlipidemia not requiring statin therapy - Plan: Lipid panel  Vesicular rash - Plan: HSV(herpes simplex vrs) 1+2 ab-IgG, Varicella Zoster Abs, IgG/IgM  Vesicular rash Painful, pruritic at times,  Recurring unilaterally on her buttock. Never cross the midline.  Could be varicella but occurring almost monthly. Will presume prior diagnosis is correct and prescribe valacyclovir.  HS antibody test ordered.     I discussed the assessment and treatment plan with the patient. The patient was provided an opportunity to ask questions and all were answered. The patient agreed with the plan and demonstrated an understanding of the instructions.   The patient was advised to call back or seek an in-person evaluation if the symptoms worsen or if the condition fails to improve as anticipated.  I provided  20 minutes of non-face-to-face time during this encounter reviewing patient's current problems and past procedures/imaging studies, providing counseling on the above mentioned problems , and coordination  of care .  Crecencio Mc, MD

## 2019-08-04 DIAGNOSIS — R238 Other skin changes: Secondary | ICD-10-CM | POA: Insufficient documentation

## 2019-08-04 DIAGNOSIS — B009 Herpesviral infection, unspecified: Secondary | ICD-10-CM | POA: Insufficient documentation

## 2019-08-04 NOTE — Assessment & Plan Note (Addendum)
Painful, pruritic at times,  Recurring unilaterally on her buttock. Never cross the midline.  Could be varicella but occurring almost monthly. Will presume prior diagnosis is correct and prescribe valacyclovir.  HS antibody test ordered.

## 2019-08-21 DIAGNOSIS — Z20828 Contact with and (suspected) exposure to other viral communicable diseases: Secondary | ICD-10-CM | POA: Diagnosis not present

## 2019-10-25 ENCOUNTER — Other Ambulatory Visit: Payer: Self-pay | Admitting: Internal Medicine

## 2019-11-15 IMAGING — DX DG CHEST 2V
2 series · 2 of 2 positions shown · non-contrast
Comparison: None.

CLINICAL DATA: Cough and shortness of breath for several weeks.

EXAM:
CHEST - 2 VIEW

[chest pa]
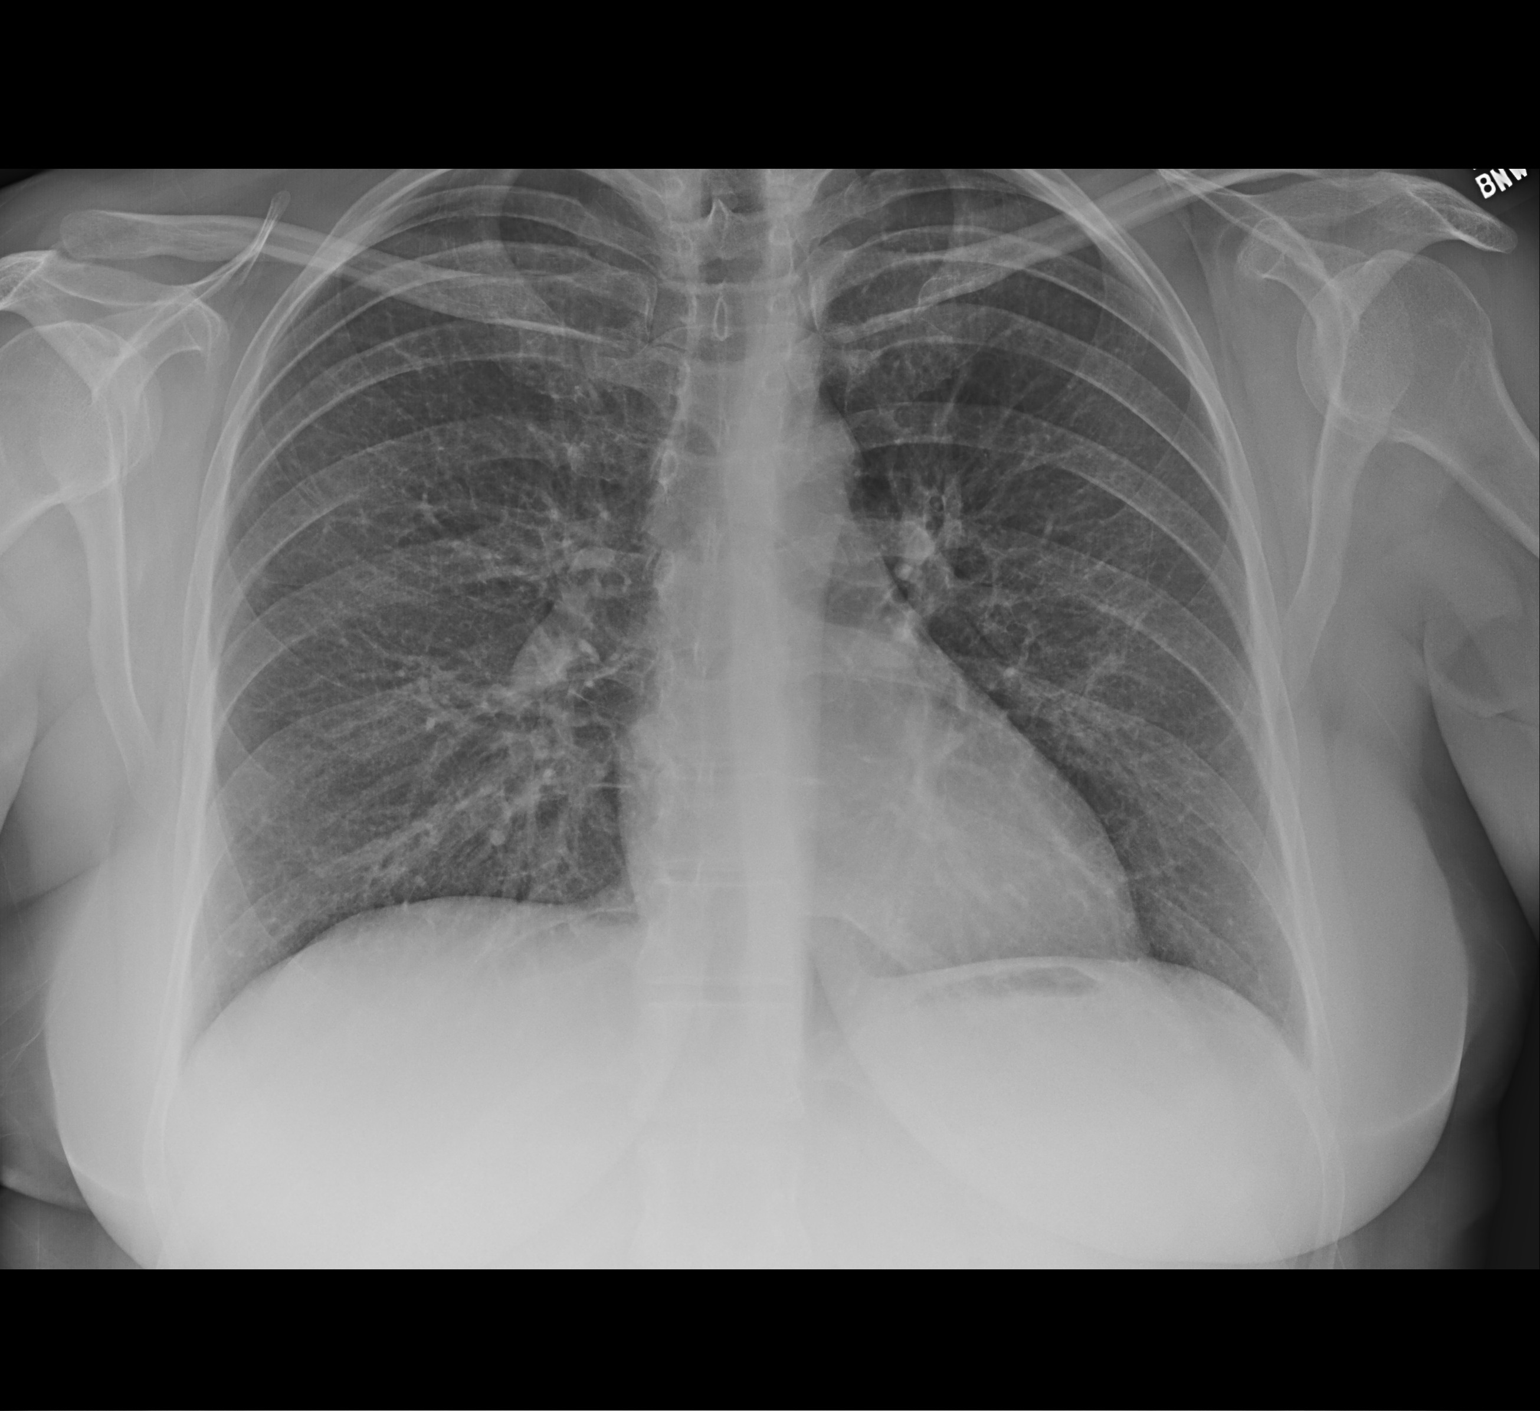

[chest lat]
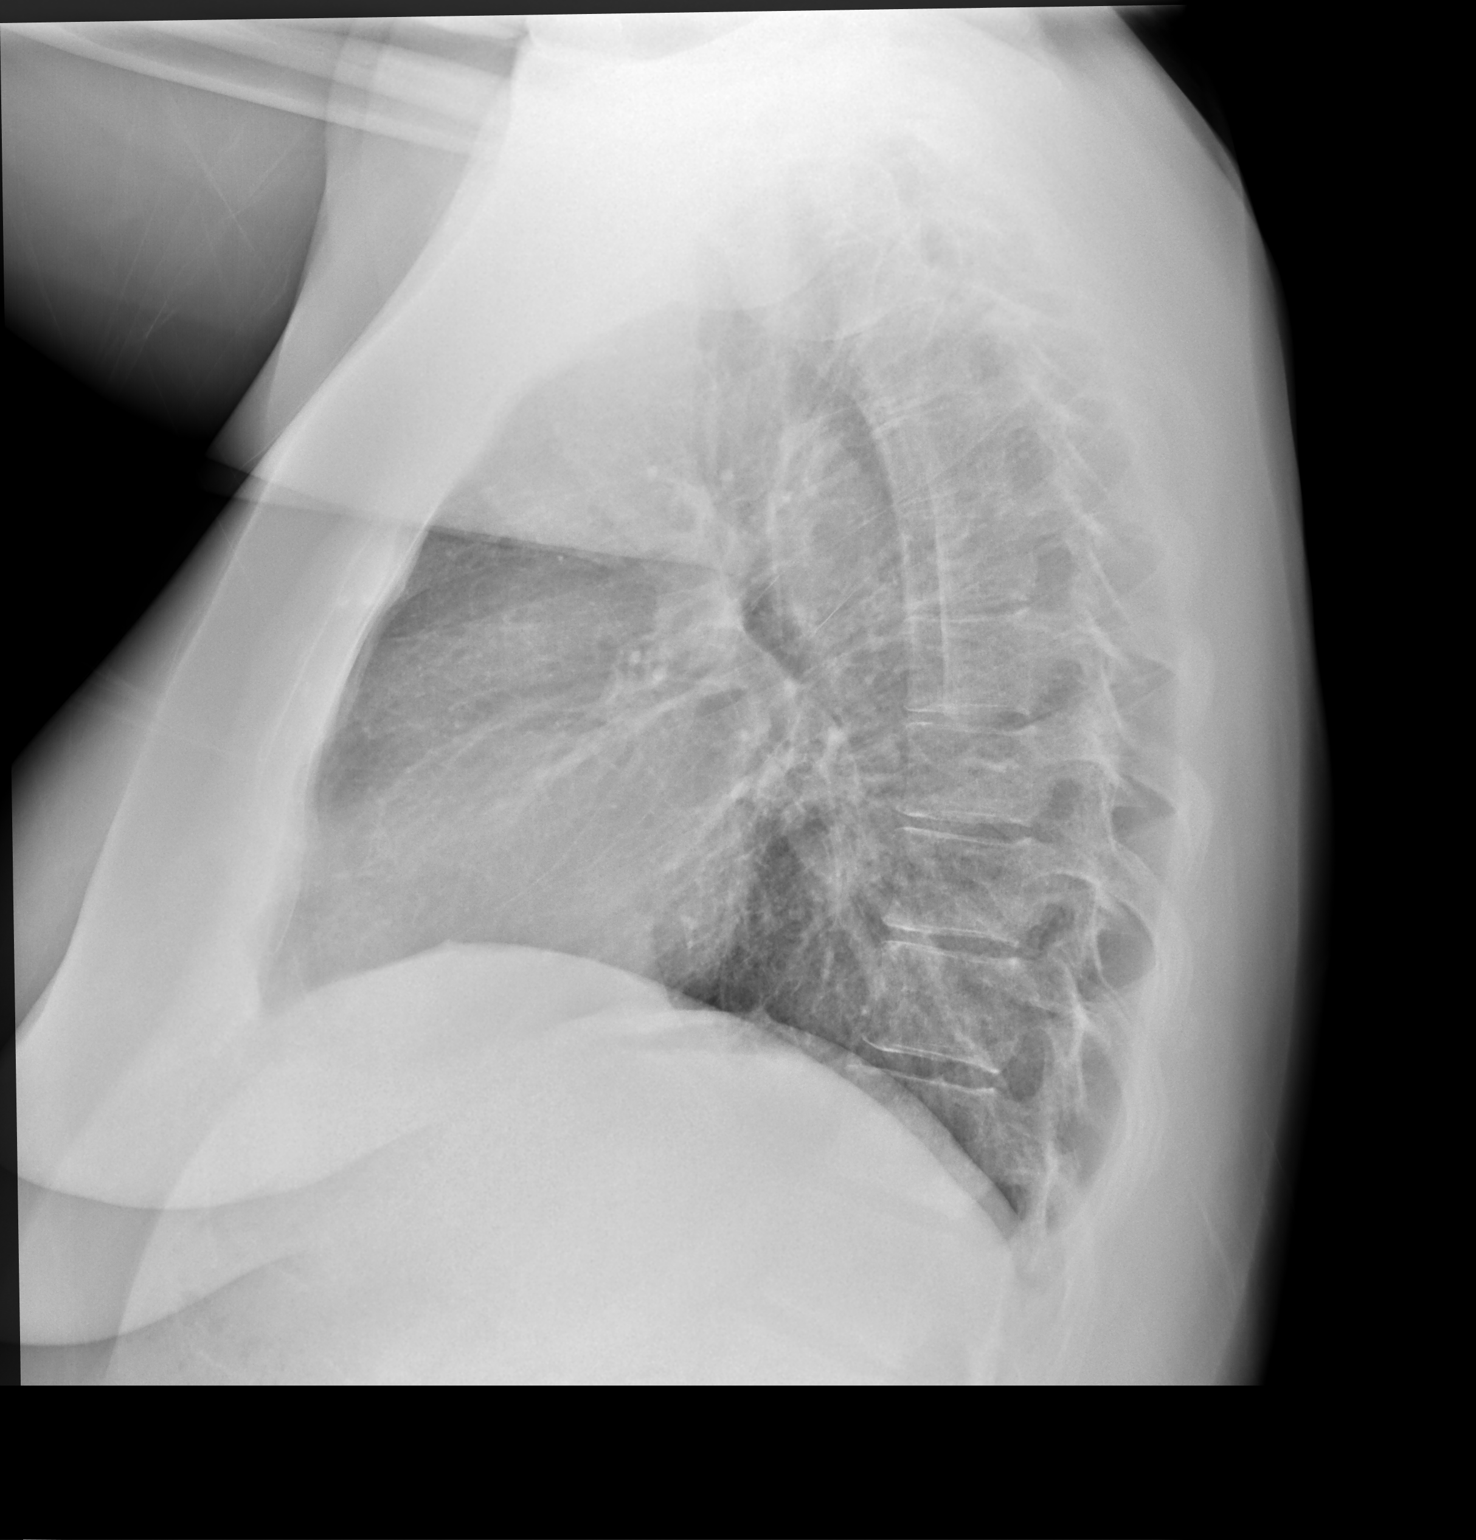

[2 of 2 positions shown; findings below may reference images not displayed]

FINDINGS: Trachea is midline. Heart size normal. Lungs are clear. No pleural
fluid.
IMPRESSION: Negative.

## 2020-01-24 ENCOUNTER — Other Ambulatory Visit: Payer: Self-pay | Admitting: Internal Medicine

## 2020-02-05 DIAGNOSIS — Z20828 Contact with and (suspected) exposure to other viral communicable diseases: Secondary | ICD-10-CM | POA: Diagnosis not present

## 2020-02-18 DIAGNOSIS — R431 Parosmia: Secondary | ICD-10-CM | POA: Diagnosis not present

## 2020-03-10 DIAGNOSIS — Z1231 Encounter for screening mammogram for malignant neoplasm of breast: Secondary | ICD-10-CM | POA: Diagnosis not present

## 2020-03-10 LAB — HM MAMMOGRAPHY

## 2020-05-24 ENCOUNTER — Encounter: Payer: Self-pay | Admitting: Gynecology

## 2020-05-24 ENCOUNTER — Ambulatory Visit
Admission: EM | Admit: 2020-05-24 | Discharge: 2020-05-24 | Disposition: A | Discharge status: Home / Self Care | Payer: BC Managed Care – PPO | Attending: Emergency Medicine | Admitting: Emergency Medicine

## 2020-05-24 ENCOUNTER — Other Ambulatory Visit: Payer: Self-pay

## 2020-05-24 ENCOUNTER — Telehealth: Payer: Self-pay

## 2020-05-24 DIAGNOSIS — H60502 Unspecified acute noninfective otitis externa, left ear: Secondary | ICD-10-CM

## 2020-05-24 MED ORDER — NEOMYCIN-POLYMYXIN-HC 3.5-10000-1 OT SUSP: 4.0000 [drp] | Freq: Four times a day (QID) | OTIC | 0 refills | Status: DC

## 2020-05-24 NOTE — ED Provider Notes (Signed)
[facility]    CSN: [id number] Arrival date & time: 05/24/20  0856      History   Chief Complaint Chief Complaint  Patient presents with  . Otalgia    HPI Alexis Little is a 45 y.o. female.   HPI   45 year old female here for evaluation of left ear pain.  Patient reports that her pain started yesterday.  Patient has a history of eczema in her left ear and states that she has been scratching it a lot due to it itching.  Patient denies drainage, fever, dizziness, or ringing in her ears.  She does say that her hearing is decreased and her ear feels stopped up as of this morning.  Past Medical History:  Diagnosis Date  . History of kidney stones   . Hyperlipidemia   . Squamous cell carcinoma, arm    Followed by Dr. Adolphus Birchwood    Patient Active Problem List   Diagnosis Date Noted  . Vesicular rash 08/04/2019  . Elevated liver enzymes 12/14/2018  . B12 deficiency 11/17/2017  . Paternal family history of cancer 06/12/2017  . Vitamin D deficiency 02/24/2016  . Chronic fatigue 12/18/2015  . Routine general medical examination at a health care facility 11/19/2015  . Irregular menstrual cycle 09/21/2015  . Breast cyst 10/16/2014  . Overweight (BMI 25.0-29.9) 02/25/2014  . Family history of colon cancer 02/25/2014  . Screening for breast cancer 02/25/2014  . Insomnia secondary to anxiety 02/25/2014    Past Surgical History:  Procedure Laterality Date  . KIDNEY STONE SURGERY    . KIDNEY STONE SURGERY     surgical removal, Dr. Valorie Roosevelt age 68  . VAGINAL DELIVERY     2    OB History    Gravida  2   Para      Term      Preterm      AB      Living  2     SAB      IAB      Ectopic      Multiple      Live Births           Obstetric Comments  1st Menstrual Cycle:  11 1st Pregnancy: 16/          Home Medications    Prior to Admission medications   Medication Sig Start Date End Date Taking? Authorizing Provider  cyanocobalamin  (,VITAMIN B-12,) 1000 MCG/ML injection INJECT 1 ML (1,000 MCG TOTAL) INTO THE MUSCLE EVERY 14 DAYS 10/25/19  Yes Arnett, Lyn Records, FNP  neomycin-polymyxin-hydrocortisone (CORTISPORIN) 3.5-10000-1 OTIC suspension Place 4 drops into the left ear 4 (four) times daily. 05/24/20  Yes Becky Augusta, NP  Syringe/Needle, Disp, (SYRINGE 3CC/25GX1") 25G X 1" 3 ML MISC Use for b12 injections 11/17/17  Yes Sherlene Shams, MD  traZODone (DESYREL) 50 MG tablet TAKE 1 TO 2 TABLETS BY MOUTH AT BEDTIME AS NEEDED FOR SLEEP 01/24/20  Yes Sherlene Shams, MD  valACYclovir (VALTREX) 1000 MG tablet Take 1 tablet (1,000 mg total) by mouth daily. 08/02/19  Yes Sherlene Shams, MD    Family History Family History  Problem Relation Age of Onset  . Heart disease Father        AFIB  . Esophageal cancer Father 64       widely metastatic; smoker; deceased 34  . Lung cancer Paternal Grandfather 4       smoker; deceased 75  . Colon cancer Paternal Uncle 73  currently 73  . Prostate cancer Paternal Uncle 38       currently 20  . Throat cancer Paternal Uncle        non-smoker; currently 46  . Cancer Cousin 18       in jaw; daughter of pat uncle with throad ca  . Pancreatic cancer Other 34       pat grandfather's sister; non-smoker; deceased 50s  . Cancer Other        lung and larynx ca in 2 other sisters of pat grandfather    Social History Social History   Tobacco Use  . Smoking status: Never Smoker  . Smokeless tobacco: Never Used  Substance Use Topics  . Alcohol use: No    Alcohol/week: 0.0 standard drinks  . Drug use: No     Allergies   Lexapro [escitalopram oxalate] and Sertraline   Review of Systems Review of Systems  HENT: Positive for ear pain. Negative for congestion, ear discharge and tinnitus.   Skin: Negative for color change.  Neurological: Negative for dizziness.  Hematological: Negative.   Psychiatric/Behavioral: Negative.      Physical Exam Triage Vital Signs ED Triage Vitals   Enc Vitals Group     BP 05/24/20 0925 116/76     Pulse Rate 05/24/20 0925 81     Resp 05/24/20 0925 16     Temp 05/24/20 0925 98.2 F (36.8 C)     Temp Source 05/24/20 0925 Oral     SpO2 --      Weight 05/24/20 0924 180 lb (81.6 kg)     Height 05/24/20 0924 5\' 7"  (1.702 m)     Head Circumference --      Peak Flow --      Pain Score 05/24/20 0924 5     Pain Loc --      Pain Edu? --      Excl. in [place]? --    No data found.  Updated Vital Signs BP 116/76 (BP Location: Left Arm)   Pulse 81   Temp 98.2 F (36.8 C) (Oral)   Resp 16   Ht 5\' 7"  (1.702 m)   Wt 180 lb (81.6 kg)   BMI 28.19 kg/m   Visual Acuity Right Eye Distance:   Left Eye Distance:   Bilateral Distance:    Right Eye Near:   Left Eye Near:    Bilateral Near:     Physical Exam Vitals and nursing note reviewed.  Constitutional:      General: She is not in acute distress.    Appearance: Normal appearance. She is normal weight. She is not toxic-appearing.  HENT:     Head: Normocephalic and atraumatic.     Right Ear: Tympanic membrane, ear canal and external ear normal.     Left Ear: Tympanic membrane and external ear normal.     Ears:     Comments: Left external auditory canal is markedly edematous and tender.  No erythema, erosions, or abrasions to the canal noted.  Discharge. Cardiovascular:     Rate and Rhythm: Normal rate and regular rhythm.     Pulses: Normal pulses.     Heart sounds: Normal heart sounds. No murmur heard. No gallop.   Pulmonary:     Effort: Pulmonary effort is normal.     Breath sounds: Normal breath sounds. No wheezing, rhonchi or rales.  Skin:    General: Skin is warm and dry.     Capillary Refill: Capillary refill takes less  than 2 seconds.  Neurological:     General: No focal deficit present.     Mental Status: She is alert.  Psychiatric:        Mood and Affect: Mood normal.        Behavior: Behavior normal.        Thought Content: Thought content normal.         Judgment: Judgment normal.      UC Treatments / Results  Labs (all labs ordered are listed, but only abnormal results are displayed) Labs Reviewed - No data to display  EKG   Radiology No results found.  Procedures Procedures (including critical care time)  Medications Ordered in UC Medications - No data to display  Initial Impression / Assessment and Plan / UC Course  I have reviewed the triage vital signs and the nursing notes.  Pertinent labs & imaging results that were available during my care of the patient were reviewed by me and considered in my medical decision making (see chart for details).   Patient is here for evaluation of left ear pain that started yesterday.  Physical exam reveals a markedly edematous and tender left external auditory canal.  There is no erythema, erosions, discharge, or abrasions to the canal.  Patient does report that she scratches it frequently with her fingernail and other objects due to having eczema in her ear.  Exam is consistent with external otitis.  Will treat patient with Cortisporin otic, 4 drops in her left ear, 4 times a day for 5 days.   Final Clinical Impressions(s) / UC Diagnoses   Final diagnoses:  Acute otitis externa of left ear, unspecified type     Discharge Instructions     Instill 4 drops of the Cortisporin otic in your left ear 4 times a day for 5 days.  Take over-the-counter ibuprofen, 600 mg every 6 hours, with food as needed for pain.  Use a heating pad or hot water bottle underneath your pillowcase at night.  The warmth will help dilate the ear canal and ease your pain.  If your symptoms do not improve you need to see your ear nose and throat doctor.    ED Prescriptions    Medication Sig Dispense Auth. Provider   neomycin-polymyxin-hydrocortisone (CORTISPORIN) 3.5-10000-1 OTIC suspension Place 4 drops into the left ear 4 (four) times daily. 10 mL Margarette Canada, NP     PDMP not reviewed this encounter.    Margarette Canada, NP 05/24/20 1001

## 2020-05-24 NOTE — Discharge Instructions (Addendum)
Instill 4 drops of the Cortisporin otic in your left ear 4 times a day for 5 days.  Take over-the-counter ibuprofen, 600 mg every 6 hours, with food as needed for pain.  Use a heating pad or hot water bottle underneath your pillowcase at night.  The warmth will help dilate the ear canal and ease your pain.  If your symptoms do not improve you need to see your ear nose and throat doctor.

## 2020-05-24 NOTE — ED Triage Notes (Signed)
Patient c/o left ear pain x yesterday. 

## 2020-05-24 NOTE — Telephone Encounter (Signed)
Medication sent to wrong pharmacy, corrected and sent.

## 2020-05-27 DIAGNOSIS — H6063 Unspecified chronic otitis externa, bilateral: Secondary | ICD-10-CM | POA: Diagnosis not present

## 2020-05-27 DIAGNOSIS — R430 Anosmia: Secondary | ICD-10-CM | POA: Diagnosis not present

## 2020-05-27 DIAGNOSIS — H60332 Swimmer's ear, left ear: Secondary | ICD-10-CM | POA: Diagnosis not present

## 2020-05-29 DIAGNOSIS — H60332 Swimmer's ear, left ear: Secondary | ICD-10-CM | POA: Diagnosis not present

## 2020-05-29 DIAGNOSIS — H6063 Unspecified chronic otitis externa, bilateral: Secondary | ICD-10-CM | POA: Diagnosis not present

## 2020-06-23 DIAGNOSIS — R232 Flushing: Secondary | ICD-10-CM | POA: Diagnosis not present

## 2020-06-23 DIAGNOSIS — R5383 Other fatigue: Secondary | ICD-10-CM | POA: Diagnosis not present

## 2020-06-23 DIAGNOSIS — R6882 Decreased libido: Secondary | ICD-10-CM | POA: Diagnosis not present

## 2020-06-23 DIAGNOSIS — N951 Menopausal and female climacteric states: Secondary | ICD-10-CM | POA: Diagnosis not present

## 2020-06-25 DIAGNOSIS — N951 Menopausal and female climacteric states: Secondary | ICD-10-CM | POA: Diagnosis not present

## 2020-06-25 DIAGNOSIS — R232 Flushing: Secondary | ICD-10-CM | POA: Diagnosis not present

## 2020-06-25 DIAGNOSIS — R6882 Decreased libido: Secondary | ICD-10-CM | POA: Diagnosis not present

## 2020-06-25 DIAGNOSIS — R5383 Other fatigue: Secondary | ICD-10-CM | POA: Diagnosis not present

## 2020-08-05 DIAGNOSIS — D225 Melanocytic nevi of trunk: Secondary | ICD-10-CM | POA: Diagnosis not present

## 2020-08-05 DIAGNOSIS — Z85828 Personal history of other malignant neoplasm of skin: Secondary | ICD-10-CM | POA: Diagnosis not present

## 2020-08-05 DIAGNOSIS — L853 Xerosis cutis: Secondary | ICD-10-CM | POA: Diagnosis not present

## 2020-08-05 DIAGNOSIS — D2261 Melanocytic nevi of right upper limb, including shoulder: Secondary | ICD-10-CM | POA: Diagnosis not present

## 2020-08-05 DIAGNOSIS — D485 Neoplasm of uncertain behavior of skin: Secondary | ICD-10-CM | POA: Diagnosis not present

## 2020-08-05 DIAGNOSIS — L728 Other follicular cysts of the skin and subcutaneous tissue: Secondary | ICD-10-CM | POA: Diagnosis not present

## 2020-08-05 DIAGNOSIS — L570 Actinic keratosis: Secondary | ICD-10-CM | POA: Diagnosis not present

## 2020-10-13 DIAGNOSIS — N951 Menopausal and female climacteric states: Secondary | ICD-10-CM | POA: Diagnosis not present

## 2020-10-13 DIAGNOSIS — R5383 Other fatigue: Secondary | ICD-10-CM | POA: Diagnosis not present

## 2020-10-14 ENCOUNTER — Telehealth: Payer: Self-pay | Admitting: Internal Medicine

## 2020-10-14 MED ORDER — FLUCONAZOLE 150 MG PO TABS: 150.0000 mg | ORAL_TABLET | Freq: Every day | ORAL | 0 refills | Status: DC

## 2020-10-14 NOTE — Telephone Encounter (Signed)
Diflucan sent to [facility]

## 2020-10-14 NOTE — Addendum Note (Signed)
Addended by: Crecencio Mc on: 10/14/2020 05:04 PM   Modules accepted: Orders

## 2020-10-14 NOTE — Telephone Encounter (Signed)
PT would like to get some meds called in for a yeast infection. Preferably the one she had called in last time when she had it.

## 2020-10-15 DIAGNOSIS — Z6829 Body mass index (BMI) 29.0-29.9, adult: Secondary | ICD-10-CM | POA: Diagnosis not present

## 2020-10-15 DIAGNOSIS — N951 Menopausal and female climacteric states: Secondary | ICD-10-CM | POA: Diagnosis not present

## 2020-10-15 DIAGNOSIS — G479 Sleep disorder, unspecified: Secondary | ICD-10-CM | POA: Diagnosis not present

## 2020-10-15 DIAGNOSIS — R232 Flushing: Secondary | ICD-10-CM | POA: Diagnosis not present

## 2020-10-15 NOTE — Telephone Encounter (Signed)
Pt is aware and stated that she picked up rx last night.

## 2020-11-21 ENCOUNTER — Other Ambulatory Visit: Payer: Self-pay | Admitting: Family

## 2020-12-19 ENCOUNTER — Ambulatory Visit (INDEPENDENT_AMBULATORY_CARE_PROVIDER_SITE_OTHER): Payer: BC Managed Care – PPO | Admitting: Internal Medicine

## 2020-12-19 ENCOUNTER — Other Ambulatory Visit: Payer: Self-pay

## 2020-12-19 ENCOUNTER — Encounter: Payer: Self-pay | Admitting: Internal Medicine

## 2020-12-19 ENCOUNTER — Telehealth: Payer: Self-pay | Admitting: Internal Medicine

## 2020-12-19 VITALS — BP 104/66 | HR 74 | Temp 96.1°F | Ht 67.0 in | Wt 197.0 lb

## 2020-12-19 DIAGNOSIS — Z1211 Encounter for screening for malignant neoplasm of colon: Secondary | ICD-10-CM

## 2020-12-19 DIAGNOSIS — Z28310 Unvaccinated for covid-19: Secondary | ICD-10-CM

## 2020-12-19 DIAGNOSIS — R002 Palpitations: Secondary | ICD-10-CM | POA: Diagnosis not present

## 2020-12-19 DIAGNOSIS — Z1231 Encounter for screening mammogram for malignant neoplasm of breast: Secondary | ICD-10-CM | POA: Diagnosis not present

## 2020-12-19 DIAGNOSIS — Z8249 Family history of ischemic heart disease and other diseases of the circulatory system: Secondary | ICD-10-CM

## 2020-12-19 DIAGNOSIS — Z0000 Encounter for general adult medical examination without abnormal findings: Secondary | ICD-10-CM

## 2020-12-19 DIAGNOSIS — Z113 Encounter for screening for infections with a predominantly sexual mode of transmission: Secondary | ICD-10-CM

## 2020-12-19 DIAGNOSIS — R635 Abnormal weight gain: Secondary | ICD-10-CM | POA: Diagnosis not present

## 2020-12-19 DIAGNOSIS — E538 Deficiency of other specified B group vitamins: Secondary | ICD-10-CM | POA: Diagnosis not present

## 2020-12-19 DIAGNOSIS — Z800 Family history of malignant neoplasm of digestive organs: Secondary | ICD-10-CM

## 2020-12-19 DIAGNOSIS — E663 Overweight: Secondary | ICD-10-CM

## 2020-12-19 DIAGNOSIS — R748 Abnormal levels of other serum enzymes: Secondary | ICD-10-CM

## 2020-12-19 DIAGNOSIS — Z2821 Immunization not carried out because of patient refusal: Secondary | ICD-10-CM

## 2020-12-19 LAB — COMPREHENSIVE METABOLIC PANEL
ALT: 10 U/L (ref 0–35)
AST: 12 U/L (ref 0–37)
Albumin: 4.3 g/dL (ref 3.5–5.2)
Alkaline Phosphatase: 50 U/L (ref 39–117)
BUN: 17 mg/dL (ref 6–23)
CO2: 31 mEq/L (ref 19–32)
Calcium: 9.3 mg/dL (ref 8.4–10.5)
Chloride: 105 mEq/L (ref 96–112)
Creatinine, Ser: 0.94 mg/dL (ref 0.40–1.20)
GFR: 72.7 mL/min (ref >60.00)
Glucose, Bld: 45 mg/dL — CL (ref 70–99)
Potassium: 4 mEq/L (ref 3.5–5.1)
Sodium: 142 mEq/L (ref 135–145)
Total Bilirubin: 0.5 mg/dL (ref 0.2–1.2)
Total Protein: 6.5 g/dL (ref 6.0–8.3)

## 2020-12-19 LAB — LIPID PANEL
Cholesterol: 208 mg/dL — ABNORMAL HIGH (ref 0–200)
HDL: 52.9 mg/dL (ref >39.00)
LDL Cholesterol: 143 mg/dL — ABNORMAL HIGH (ref 0–99)
NonHDL: 154.86
Total CHOL/HDL Ratio: 4
Triglycerides: 57 mg/dL (ref 0.0–149.0)
VLDL: 11.4 mg/dL (ref 0.0–40.0)

## 2020-12-19 LAB — VITAMIN B12: Vitamin B-12: 574 pg/mL (ref 211–911)

## 2020-12-19 LAB — TSH: TSH: 1.1 u[IU]/mL (ref 0.35–5.50)

## 2020-12-19 NOTE — Progress Notes (Signed)
Patient ID: Alexis Little, female    DOB: 04-06-1976  Age: 45 y.o. MRN: [id number]  The patient is here for annual preventive examination and management of other chronic and acute problems.  This visit occurred during the SARS-CoV-2 public health emergency.  Safety protocols were in place, including screening questions prior to the visit, additional usage of staff PPE, and extensive cleaning of exam room while observing appropriate contact time as indicated for disinfecting solutions.     The risk factors are reflected in the social history.  The roster of all physicians providing medical care to patient - is listed in the Snapshot section of the chart.  Activities of daily living:  The patient is 100% independent in all ADLs: dressing, toileting, feeding as well as independent mobility  Home safety : The patient has smoke detectors in the home. They wear seatbelts.  There are no firearms at home. There is no violence in the home.   There is no risks for hepatitis, STDs or HIV. There is no   history of blood transfusion. They have no travel history to infectious disease endemic areas of the world.  The patient has seen their dentist in the last six month. They have seen their eye doctor in the last year. She denies  hearing difficulty with regard to whispered voices and some television programs.  They have deferred audiologic testing in the last year.  They do not  have excessive sun exposure. Discussed the need for sun protection: hats, long sleeves and use of sunscreen if there is significant sun exposure.   Diet: the importance of a healthy diet is discussed. They do have a healthy diet.  The benefits of regular aerobic exercise were discussed. She walks 4 times per week ,  20 minutes.   Depression screen: there are no signs or vegative symptoms of depression- irritability, change in appetite, anhedonia, sadness/tearfullness.  Cognitive assessment: the patient manages all their financial  and personal affairs and is actively engaged. They could relate day,date,year and events; recalled 2/3 objects at 3 minutes; performed clock-face test normally.  The following portions of the patient's history were reviewed and updated as appropriate: allergies, current medications, past family history, past medical history,  past surgical history, past social history  and problem list.  Visual acuity was not assessed per patient preference since she has regular follow up with her ophthalmologist. Hearing and body mass index were assessed and reviewed.   During the course of the visit the patient was educated and counseled about appropriate screening and preventive services including : fall prevention , diabetes screening, nutrition counseling, colorectal cancer screening, and recommended immunizations.    CC: The primary encounter diagnosis was Encounter for screening mammogram for malignant neoplasm of breast. Diagnoses of Weight gain, Screen for STD (sexually transmitted disease), B12 deficiency, COVID-19 vaccine series declined, Palpitations, Family history of cardiomyopathy, Colon cancer screening, Overweight (BMI 25.0-29.9), Family history of colon cancer, Routine general medical examination at a health care facility, and Elevated liver enzymes were also pertinent to this visit.  1) weight gain: discussed  her eating habits ,  lifestyle,  diet,  husband recently diagnosed with uncontrolled type 2 DM.  Low GI diet started 2 days ago for both of them.  2) chronic constipation:  using miralax , mag citrate,  enemas.  Having abd pain at night relieved with defecation.  Has a BM every 4 days.   Needs to see GI   3) Family history of some form  of cardiomyopathy that causes   low EF .(Father and grandparents) .  Niece has a cardiomyopathy.  Patient has has recurrent palpitations occurring about once a week , at rest, drinks 1-2 dr Samson Frederic daily,  wants to see cardiology.  [facility]     History Brad has  a past medical history of History of kidney stones, Hyperlipidemia, and Squamous cell carcinoma, arm.   She has a past surgical history that includes Kidney stone surgery; Kidney stone surgery; and Vaginal delivery.   Her family history includes Cancer in an other family member; Cancer (age of onset: 60) in her cousin; Colon cancer (age of onset: 51) in her paternal uncle; Esophageal cancer (age of onset: 66) in her father; Heart disease in her father; Lung cancer (age of onset: 75) in her paternal grandfather; Pancreatic cancer (age of onset: 62) in an other family member; Prostate cancer (age of onset: 70) in her paternal uncle; Throat cancer in her paternal uncle.She reports that she has never smoked. She has never used smokeless tobacco. She reports that she does not drink alcohol and does not use drugs.  Outpatient Medications Prior to Visit  Medication Sig Dispense Refill   cyanocobalamin (,VITAMIN B-12,) 1000 MCG/ML injection INJECT 1 ML (1,000 MCG TOTAL) INTO THE MUSCLE EVERY 14 DAYS 10 mL 1   neomycin-polymyxin-hydrocortisone (CORTISPORIN) 3.5-10000-1 OTIC suspension Place 4 drops into the left ear 4 (four) times daily. 10 mL 0   Syringe/Needle, Disp, (SYRINGE 3CC/25GX1") 25G X 1" 3 ML MISC Use for b12 injections 50 each 0   traZODone (DESYREL) 50 MG tablet TAKE 1 TO 2 TABLETS BY MOUTH AT BEDTIME AS NEEDED FOR SLEEP 60 tablet 3   valACYclovir (VALTREX) 1000 MG tablet Take 1 tablet (1,000 mg total) by mouth daily. 90 tablet 3   fluconazole (DIFLUCAN) 150 MG tablet Take 1 tablet (150 mg total) by mouth daily. (Patient not taking: Reported on 12/19/2020) 2 tablet 0   No facility-administered medications prior to visit.    Review of Systems  Patient denies headache, fevers, malaise, unintentional weight loss, skin rash, eye pain, sinus congestion and sinus pain, sore throat, dysphagia,  hemoptysis , cough, dyspnea, wheezing, chest pain, palpitations, orthopnea, edema, abdominal pain, nausea,  melena, diarrhea, constipation, flank pain, dysuria, hematuria, urinary  Frequency, nocturia, numbness, tingling, seizures,  Focal weakness, Loss of consciousness,  Tremor, insomnia, depression, anxiety, and suicidal ideation.    Objective:  BP 104/66 (BP Location: Left Arm, Patient Position: Sitting, Cuff Size: Large)   Pulse 74   Temp (!) 96.1 F (35.6 C) (Temporal)   Ht '5\' 7"'$  (1.702 m)   Wt 197 lb (89.4 kg)   SpO2 99%   BMI 30.85 kg/m   Physical Exam  General appearance: alert, cooperative and appears stated age Head: Normocephalic, without obvious abnormality, atraumatic Eyes: conjunctivae/corneas clear. PERRL, EOM's intact. Fundi benign. Ears: normal TM's and external ear canals both ears Nose: Nares normal. Septum midline. Mucosa normal. No drainage or sinus tenderness. Throat: lips, mucosa, and tongue normal; teeth and gums normal Neck: no adenopathy, no carotid bruit, no JVD, supple, symmetrical, trachea midline and thyroid not enlarged, symmetric, no tenderness/mass/nodules Lungs: clear to auscultation bilaterally Breasts: normal appearance, no masses or tenderness Heart: regular rate and rhythm, S1, S2 normal, no murmur, click, rub or gallop Abdomen: soft, non-tender; bowel sounds normal; no masses,  no organomegaly Extremities: extremities normal, atraumatic, no cyanosis or edema Pulses: 2+ and symmetric Skin: Skin color, texture, turgor normal. No rashes or lesions Neurologic:  Alert and oriented X 3, normal strength and tone. Normal symmetric reflexes. Normal coordination and gait.     Assessment & Plan:   Problem List Items Addressed This Visit       Unprioritized   Overweight (BMI 25.0-29.9)    I have encouraged eeight loss with goal of 10% of body weight over the next 6 months using a low glycemic index diet and regular exercise a minimum of 5 days per week.         Family history of colon cancer    Referral to GI for colonoscopy given worsening  constipation       Screening for breast cancer - Primary   Relevant Orders   MM DIGITAL SCREENING BILATERAL   Routine general medical examination at a health care facility    age appropriate education and counseling updated, referrals for preventative services and immunizations addressed, dietary and smoking counseling addressed, most recent labs reviewed.  I have personally reviewed and have noted:   1) the patient's medical and social history 2) The pt's use of alcohol, tobacco, and illicit drugs 3) The patient's current medications and supplements 4) Functional ability including ADL's, fall risk, home safety risk, hearing and visual impairment 5) Diet and physical activities 6) Evidence for depression or mood disorder 7) The patient's height, weight, and BMI have been recorded in the chart   I have made referrals, and provided counseling and education based on review of the above       B12 deficiency   Relevant Orders   Vitamin B12 (Completed)   Family history of cardiomyopathy    Details are unclear. But she is requesting a cardiology evaluation .  She is asymptomatic except for occasional palpitations       Relevant Orders   Ambulatory referral to Cardiology   RESOLVED: Elevated liver enzymes      Lab Results  Component Value Date   ALT 10 12/19/2020   AST 12 12/19/2020   ALKPHOS 50 12/19/2020   BILITOT 0.5 12/19/2020         Other Visit Diagnoses     Weight gain       Relevant Orders   Lipid panel (Completed)   Comprehensive metabolic panel (Completed)   TSH (Completed)   Screen for STD (sexually transmitted disease)       Relevant Orders   Hepatitis C antibody   HIV Antibody (routine testing w rflx)   COVID-19 vaccine series declined       Relevant Orders   SARS-CoV-2 Semi-Quantitative Total Antibody, Spike   Palpitations       Relevant Orders   Ambulatory referral to Cardiology   Colon cancer screening       Relevant Orders   Ambulatory  referral to Gastroenterology     No orders of the defined types were placed in this encounter.   Medications Discontinued During This Encounter  Medication Reason   fluconazole (DIFLUCAN) 150 MG tablet Completed Course    Follow-up: No follow-ups on file.   Crecencio Mc, MD

## 2020-12-19 NOTE — Telephone Encounter (Signed)
CRITICAL VALUE STICKER  CRITICAL VALUE:Low Glucose of (46)  RECEIVER (on-site recipient of call):Bailie Christenbury  DATE & TIME NOTIFIED: 12/19/2020, 2:57 pm  MESSENGER (representative from lab):Hope  MD NOTIFIED: Dr. Derrel Nip  TIME OF NOTIFICATION: 3:00 pm  RESPONSE:

## 2020-12-19 NOTE — Patient Instructions (Addendum)
I will make referrals to   [facility] cardiology   [facility]    Reducing Portion size and engaging  regularly in exercise are still  the best lifestyle changes for healthy weight maintenance     [employer] "low carb power bowl"  entrees and  "Steamer" entrees are are great low carb entrees that microwave in 5 minutes

## 2020-12-21 DIAGNOSIS — Z8249 Family history of ischemic heart disease and other diseases of the circulatory system: Secondary | ICD-10-CM | POA: Insufficient documentation

## 2020-12-21 NOTE — Assessment & Plan Note (Signed)
   Lab Results  Component Value Date   ALT 10 12/19/2020   AST 12 12/19/2020   ALKPHOS 50 12/19/2020   BILITOT 0.5 12/19/2020

## 2020-12-21 NOTE — Assessment & Plan Note (Signed)

## 2020-12-21 NOTE — Assessment & Plan Note (Addendum)
I have encouraged eeight loss with goal of 10% of body weight over the next 6 months using a low glycemic index diet and regular exercise a minimum of 5 days per week.

## 2020-12-21 NOTE — Assessment & Plan Note (Signed)
Referral to GI for colonoscopy given worsening constipation

## 2020-12-21 NOTE — Assessment & Plan Note (Addendum)
Details are unclear. But she is requesting a cardiology evaluation .  She is asymptomatic except for occasional palpitations

## 2020-12-22 ENCOUNTER — Telehealth: Payer: Self-pay

## 2020-12-22 DIAGNOSIS — Z28310 Unvaccinated for covid-19: Secondary | ICD-10-CM

## 2020-12-22 DIAGNOSIS — Z2821 Immunization not carried out because of patient refusal: Secondary | ICD-10-CM

## 2020-12-22 NOTE — Telephone Encounter (Signed)
Patient declined to have antibody test completed at this time.

## 2020-12-23 LAB — HEPATITIS C ANTIBODY
Hepatitis C Ab: NONREACTIVE
SIGNAL TO CUT-OFF: 0.01 (ref <1.00)

## 2020-12-23 LAB — HIV ANTIBODY (ROUTINE TESTING W REFLEX): HIV 1&2 Ab, 4th Generation: NONREACTIVE

## 2020-12-24 ENCOUNTER — Telehealth: Payer: Self-pay

## 2020-12-24 NOTE — Telephone Encounter (Signed)
Gastroenterology Pre-Procedure Review  Made patient a office visit appointment for constipation and abdominal pain. Had a colonoscopy in her 20's and has had some EGD done   PATIENT REVIEW QUESTIONS: The patient responded to the following health history questions as indicated:    1. Are you having any GI issues? Yes  2. Do you have a personal history of Polyps? No  3. Do you have a family history of Colon Cancer or Polyps? Yes mother had colon polyps  4. Diabetes Mellitus? no 5. Joint replacements in the past 12 months?no 6. Major health problems in the past 3 months?no 7. Any artificial heart valves, MVP, or defibrillator?no    MEDICATIONS & ALLERGIES:    Patient reports the following regarding taking any anticoagulation/antiplatelet therapy:   Plavix, Coumadin, Eliquis, Xarelto, Lovenox, Pradaxa, Brilinta, or Effient? no Aspirin? no  Patient confirms/reports the following medications:  Current Outpatient Medications  Medication Sig Dispense Refill   cyanocobalamin (,VITAMIN B-12,) 1000 MCG/ML injection INJECT 1 ML (1,000 MCG TOTAL) INTO THE MUSCLE EVERY 14 DAYS 10 mL 1   neomycin-polymyxin-hydrocortisone (CORTISPORIN) 3.5-10000-1 OTIC suspension Place 4 drops into the left ear 4 (four) times daily. 10 mL 0   Syringe/Needle, Disp, (SYRINGE 3CC/25GX1") 25G X 1" 3 ML MISC Use for b12 injections 50 each 0   traZODone (DESYREL) 50 MG tablet TAKE 1 TO 2 TABLETS BY MOUTH AT BEDTIME AS NEEDED FOR SLEEP 60 tablet 3   valACYclovir (VALTREX) 1000 MG tablet Take 1 tablet (1,000 mg total) by mouth daily. 90 tablet 3   No current facility-administered medications for this visit.    Patient confirms/reports the following allergies:  Allergies  Allergen Reactions   Lexapro [Escitalopram Oxalate]     Racing thoughts   Sertraline     Unable to focus, panic attacks    No orders of the defined types were placed in this encounter.   AUTHORIZATION INFORMATION Primary Insurance: 1D#: Group  #:  Secondary Insurance: 1D#: Group #:  SCHEDULE INFORMATION: Date:  Time: Location:

## 2021-01-01 DIAGNOSIS — R600 Localized edema: Secondary | ICD-10-CM | POA: Diagnosis not present

## 2021-01-01 DIAGNOSIS — R0602 Shortness of breath: Secondary | ICD-10-CM

## 2021-01-01 DIAGNOSIS — R002 Palpitations: Secondary | ICD-10-CM

## 2021-01-01 HISTORY — DX: Shortness of breath: R06.02

## 2021-01-01 HISTORY — DX: Palpitations: R00.2

## 2021-01-01 HISTORY — DX: Localized edema: R60.0

## 2021-01-08 DIAGNOSIS — R600 Localized edema: Secondary | ICD-10-CM | POA: Diagnosis not present

## 2021-01-08 DIAGNOSIS — R0602 Shortness of breath: Secondary | ICD-10-CM | POA: Diagnosis not present

## 2021-01-19 ENCOUNTER — Telehealth: Payer: Self-pay | Admitting: Internal Medicine

## 2021-01-19 DIAGNOSIS — R300 Dysuria: Secondary | ICD-10-CM

## 2021-01-19 NOTE — Telephone Encounter (Signed)
Is it okay if I have her drop off a urine sample and then scheduled her for a virtual 15 minute appt on Thursday?

## 2021-01-19 NOTE — Telephone Encounter (Signed)
Patient thinks she had a UTI. She is watching her Aunt and can not leave her to go to urgent care. She would like to do a urine sample.

## 2021-01-20 NOTE — Telephone Encounter (Signed)
Spoke with pt and she stated that she started taking some OTC medication that has cranberry in it and she is feeling better this morning. Advised pt that if her symptoms return to please give the office a call back. Pt gave a verbal understanding.

## 2021-01-23 ENCOUNTER — Other Ambulatory Visit: Payer: Self-pay

## 2021-01-23 ENCOUNTER — Other Ambulatory Visit: Payer: BC Managed Care – PPO

## 2021-01-23 DIAGNOSIS — R300 Dysuria: Secondary | ICD-10-CM | POA: Diagnosis not present

## 2021-01-23 NOTE — Addendum Note (Signed)
Addended byElpidio Galea T on: 01/23/2021 02:34 PM   Modules accepted: Orders

## 2021-01-23 NOTE — Telephone Encounter (Signed)
Lab orders have been placed. Patient needs lab appointment and a 15 virtual appointment work in.

## 2021-01-23 NOTE — Telephone Encounter (Signed)
Patient called back in and about the message below to see if she can have orders put in to drop off a urine sample.She is currently in pain.

## 2021-01-25 LAB — URINALYSIS, ROUTINE W REFLEX MICROSCOPIC
Bilirubin Urine: NEGATIVE
Glucose, UA: NEGATIVE
Ketones, ur: NEGATIVE
Nitrite: POSITIVE — AB
Specific Gravity, Urine: 1.018 (ref 1.001–1.035)
pH: 7 (ref 5.0–8.0)

## 2021-01-25 LAB — URINE CULTURE
MICRO NUMBER:: 12327760
SPECIMEN QUALITY:: ADEQUATE

## 2021-01-25 LAB — MICROSCOPIC MESSAGE

## 2021-01-26 ENCOUNTER — Other Ambulatory Visit: Payer: Self-pay | Admitting: Internal Medicine

## 2021-01-26 DIAGNOSIS — B9620 Unspecified Escherichia coli [E. coli] as the cause of diseases classified elsewhere: Secondary | ICD-10-CM | POA: Insufficient documentation

## 2021-01-26 MED ORDER — CIPROFLOXACIN HCL 250 MG PO TABS: 250.0000 mg | ORAL_TABLET | Freq: Two times a day (BID) | ORAL | 0 refills | Status: AC

## 2021-01-27 ENCOUNTER — Ambulatory Visit: Payer: BC Managed Care – PPO | Admitting: Gastroenterology

## 2021-01-27 DIAGNOSIS — R5383 Other fatigue: Secondary | ICD-10-CM | POA: Diagnosis not present

## 2021-01-27 DIAGNOSIS — N951 Menopausal and female climacteric states: Secondary | ICD-10-CM | POA: Diagnosis not present

## 2021-01-28 ENCOUNTER — Encounter: Payer: Self-pay | Admitting: Internal Medicine

## 2021-01-28 ENCOUNTER — Other Ambulatory Visit: Payer: Self-pay

## 2021-01-28 ENCOUNTER — Telehealth (INDEPENDENT_AMBULATORY_CARE_PROVIDER_SITE_OTHER): Payer: BC Managed Care – PPO | Admitting: Internal Medicine

## 2021-01-28 DIAGNOSIS — N390 Urinary tract infection, site not specified: Secondary | ICD-10-CM

## 2021-01-28 DIAGNOSIS — B9620 Unspecified Escherichia coli [E. coli] as the cause of diseases classified elsewhere: Secondary | ICD-10-CM | POA: Diagnosis not present

## 2021-01-28 MED ORDER — FLUCONAZOLE 150 MG PO TABS: 150.0000 mg | ORAL_TABLET | Freq: Every day | ORAL | 0 refills | Status: DC

## 2021-01-28 NOTE — Progress Notes (Signed)
Virtual Visit via [place] Note  This visit type was conducted due to national recommendations for restrictions regarding the COVID-19 pandemic (e.g. social distancing).  This format is felt to be most appropriate for this patient at this time.  All issues noted in this document were discussed and addressed.  No physical exam was performed (except for noted visual exam findings with Video Visits).   I connected withNAME@ on 01/28/21 at 12:00 PM EDT by a video enabled telemedicine application  and verified that I am speaking with the correct person using two identifiers. Location patient: home Location provider: work or home office Persons participating in the virtual visit: patient, provider  I discussed the limitations, risks, security and privacy concerns of performing an evaluation and management service by telephone and the availability of in person appointments. I also discussed with the patient that there may be a patient responsible charge related to this service. The patient expressed understanding and agreed to proceed.   Reason for visit:  follow up on UTI  HPI:  45 yr old female PRESENTS for follow up on UTI tht presented iwht end void  urethral pain on or around August 29,  the day after she swam in a chlorinated swimming pool.  Was unable to provide urine specimen until sept 2.  Started taking generic Septra Friday evening (from a friend)  but switched to cipro  (which was called in on Monday night Sept 5)   on Sept 6 .  Symptoms were still present on Sept 6 and have improved overnight .  Denies nausea, flank pain,  fevers and hematuria.   E coli culture results reviewed with patient    ROS: See pertinent positives and negatives per HPI.  Past Medical History:  Diagnosis Date   History of kidney stones    Hyperlipidemia    Squamous cell carcinoma, arm    Followed by Dr. Evorn Gong    Past Surgical History:  Procedure Laterality Date   KIDNEY [place]     surgical removal, Dr. Pearlie Oyster age 56   VAGINAL DELIVERY     1    Family History  Problem Relation Age of Onset   Heart disease Father        AFIB   Esophageal cancer Father 71       widely metastatic; smoker; deceased 32   Lung cancer Paternal Grandfather 13       smoker; deceased 40   Colon cancer Paternal Uncle 41       currently 43   Prostate cancer Paternal Uncle 96       currently 51   Throat cancer Paternal Uncle        non-smoker; currently 75   Cancer Cousin 61       in jaw; daughter of pat uncle with throad ca   Pancreatic cancer Other 63       pat grandfather's sister; non-smoker; deceased 30s   Cancer Other        lung and larynx ca in 2 other sisters of pat grandfather    SOCIAL HX:  reports that she has never smoked. She has never used smokeless tobacco. She reports that she does not drink alcohol and does not use drugs.    Current Outpatient Medications:    ciprofloxacin (CIPRO) 250 MG tablet, Take 1 tablet (250 mg total) by mouth 2 (two) times daily for 3 days., Disp: 6 tablet, Rfl: 0   cyanocobalamin (,VITAMIN  B-12,) 1000 MCG/ML injection, INJECT 1 ML (1,000 MCG TOTAL) INTO THE MUSCLE EVERY 14 DAYS, Disp: 10 mL, Rfl: 1   fluconazole (DIFLUCAN) 150 MG tablet, Take 1 tablet (150 mg total) by mouth daily., Disp: 2 tablet, Rfl: 0   neomycin-polymyxin-hydrocortisone (CORTISPORIN) 3.5-10000-1 OTIC suspension, Place 4 drops into the left ear 4 (four) times daily., Disp: 10 mL, Rfl: 0   Syringe/Needle, Disp, (SYRINGE 3CC/25GX1") 25G X 1" 3 ML MISC, Use for b12 injections, Disp: 50 each, Rfl: 0   traZODone (DESYREL) 50 MG tablet, TAKE 1 TO 2 TABLETS BY MOUTH AT BEDTIME AS NEEDED FOR SLEEP, Disp: 60 tablet, Rfl: 3   valACYclovir (VALTREX) 1000 MG tablet, Take 1 tablet (1,000 mg total) by mouth daily., Disp: 90 tablet, Rfl: 3  EXAM:  VITALS per patient if applicable:  GENERAL: alert, oriented, appears well and in no acute distress  HEENT: atraumatic,  conjunttiva clear, no obvious abnormalities on inspection of external nose and ears  NECK: normal movements of the head and neck  LUNGS: on inspection no signs of respiratory distress, breathing rate appears normal, no obvious gross SOB, gasping or wheezing  CV: no obvious cyanosis  MS: moves all visible extremities without noticeable abnormality  PSYCH/NEURO: pleasant and cooperative, no obvious depression or anxiety, speech and thought processing grossly intact  ASSESSMENT AND PLAN:  Discussed the following assessment and plan:  E. coli UTI (urinary tract infection)  E. coli UTI (urinary tract infection) Symptoms were only marginally improved on  Septra started empirically by patient on Sept 2 but have markedly improved after starting Cipro on Sept 6.  Culture and sensitivities support cipro >>Septra.  Reminded to take probiotic for 3 weeks.  Diflucan rx sent per patient request to use prn vaginitis     I discussed the assessment and treatment plan with the patient. The patient was provided an opportunity to ask questions and all were answered. The patient agreed with the plan and demonstrated an understanding of the instructions.   The patient was advised to call back or seek an in-person evaluation if the symptoms worsen or if the condition fails to improve as anticipated.      Crecencio Mc, MD

## 2021-01-28 NOTE — Assessment & Plan Note (Signed)
Symptoms were only marginally improved on  Septra started empirically by patient on Sept 2 but have markedly improved after starting Cipro on Sept 6.  Culture and sensitivities support cipro >>Septra.  Reminded to take probiotic for 3 weeks.  Diflucan rx sent per patient request to use prn vaginitis

## 2021-01-29 DIAGNOSIS — N951 Menopausal and female climacteric states: Secondary | ICD-10-CM | POA: Diagnosis not present

## 2021-01-29 DIAGNOSIS — R6882 Decreased libido: Secondary | ICD-10-CM | POA: Diagnosis not present

## 2021-01-29 DIAGNOSIS — K5904 Chronic idiopathic constipation: Secondary | ICD-10-CM | POA: Diagnosis not present

## 2021-01-29 DIAGNOSIS — G479 Sleep disorder, unspecified: Secondary | ICD-10-CM | POA: Diagnosis not present

## 2021-02-11 ENCOUNTER — Ambulatory Visit: Payer: BC Managed Care – PPO | Admitting: Gastroenterology

## 2021-03-13 DIAGNOSIS — Z1231 Encounter for screening mammogram for malignant neoplasm of breast: Secondary | ICD-10-CM | POA: Diagnosis not present

## 2021-03-13 LAB — HM MAMMOGRAPHY

## 2021-04-30 DIAGNOSIS — R5383 Other fatigue: Secondary | ICD-10-CM | POA: Diagnosis not present

## 2021-04-30 DIAGNOSIS — N951 Menopausal and female climacteric states: Secondary | ICD-10-CM | POA: Diagnosis not present

## 2021-05-05 DIAGNOSIS — G479 Sleep disorder, unspecified: Secondary | ICD-10-CM | POA: Diagnosis not present

## 2021-05-05 DIAGNOSIS — R232 Flushing: Secondary | ICD-10-CM | POA: Diagnosis not present

## 2021-05-05 DIAGNOSIS — Z6829 Body mass index (BMI) 29.0-29.9, adult: Secondary | ICD-10-CM | POA: Diagnosis not present

## 2021-05-05 DIAGNOSIS — N951 Menopausal and female climacteric states: Secondary | ICD-10-CM | POA: Diagnosis not present

## 2021-05-26 DIAGNOSIS — N951 Menopausal and female climacteric states: Secondary | ICD-10-CM | POA: Diagnosis not present

## 2021-07-02 ENCOUNTER — Telehealth: Payer: Self-pay | Admitting: Internal Medicine

## 2021-07-02 NOTE — Telephone Encounter (Signed)
Patient called in having very low pressure 90/48 this morning , patient feet, hands and  nose are cold. Patient is very tired

## 2021-07-03 ENCOUNTER — Other Ambulatory Visit: Payer: Self-pay

## 2021-07-03 ENCOUNTER — Encounter: Payer: Self-pay | Admitting: Family

## 2021-07-03 ENCOUNTER — Ambulatory Visit: Payer: BC Managed Care – PPO | Admitting: Family

## 2021-07-03 VITALS — BP 102/60 | HR 73 | Temp 97.8°F | Ht 67.0 in | Wt 192.6 lb

## 2021-07-03 DIAGNOSIS — I959 Hypotension, unspecified: Secondary | ICD-10-CM | POA: Diagnosis not present

## 2021-07-03 LAB — CBC WITH DIFFERENTIAL/PLATELET
Basophils Absolute: 0 10*3/uL (ref 0.0–0.1)
Basophils Relative: 1 % (ref 0.0–3.0)
Eosinophils Absolute: 0.2 10*3/uL (ref 0.0–0.7)
Eosinophils Relative: 3.4 % (ref 0.0–5.0)
HCT: 45 % (ref 36.0–46.0)
Hemoglobin: 14.8 g/dL (ref 12.0–15.0)
Lymphocytes Relative: 25.4 % (ref 12.0–46.0)
Lymphs Abs: 1.1 10*3/uL (ref 0.7–4.0)
MCHC: 32.8 g/dL (ref 30.0–36.0)
MCV: 90.5 fl (ref 78.0–100.0)
Monocytes Absolute: 0.3 10*3/uL (ref 0.1–1.0)
Monocytes Relative: 6.8 % (ref 3.0–12.0)
Neutro Abs: 2.8 10*3/uL (ref 1.4–7.7)
Neutrophils Relative %: 63.4 % (ref 43.0–77.0)
Platelets: 274 10*3/uL (ref 150.0–400.0)
RBC: 4.97 Mil/uL (ref 3.87–5.11)
RDW: 12.6 % (ref 11.5–15.5)
WBC: 4.4 10*3/uL (ref 4.0–10.5)

## 2021-07-03 LAB — COMPREHENSIVE METABOLIC PANEL
ALT: 13 U/L (ref 0–35)
AST: 15 U/L (ref 0–37)
Albumin: 4.2 g/dL (ref 3.5–5.2)
Alkaline Phosphatase: 53 U/L (ref 39–117)
BUN: 10 mg/dL (ref 6–23)
CO2: 31 mEq/L (ref 19–32)
Calcium: 9.4 mg/dL (ref 8.4–10.5)
Chloride: 105 mEq/L (ref 96–112)
Creatinine, Ser: 0.8 mg/dL (ref 0.40–1.20)
GFR: 87.89 mL/min (ref >60.00)
Glucose, Bld: 77 mg/dL (ref 70–99)
Potassium: 4.6 mEq/L (ref 3.5–5.1)
Sodium: 141 mEq/L (ref 135–145)
Total Bilirubin: 0.6 mg/dL (ref 0.2–1.2)
Total Protein: 5.9 g/dL — ABNORMAL LOW (ref 6.0–8.3)

## 2021-07-03 LAB — MAGNESIUM: Magnesium: 1.9 mg/dL (ref 1.5–2.5)

## 2021-07-03 LAB — TSH: TSH: 0.98 u[IU]/mL (ref 0.35–5.50)

## 2021-07-03 NOTE — Patient Instructions (Signed)
Continue to liberalize ( add ) salt to your diet.   Please be careful with positions changes.   I am working diligently on arranging a second cardiac consult as a relates to low blood pressure. In the office, please ensure you bring your blood pressure monitor at home so we can check it with ours as well.  Referral in place to cardiology Let us know if you dont hear back within a week in regards to an appointment being scheduled.

## 2021-07-03 NOTE — Progress Notes (Signed)
Subjective:    Patient ID: Alexis Little, female    DOB: 12/26/1975, 46 y.o.   MRN: [id number]  CC: Alexis Little is a 46 y.o. female who presents today for an acute visit.    HPI:  She has been noticing  blood pressure , particularly diasytolic , lower than has been in the past. She reports h/o of low end blood pressure for most of her life.    However 2 days ago , she became worried as had episode of 90/43. She became symptomatic with hands, feet becoming very cold.  She had a blood pressures at home 112/64, 104/54, 109/61, 112/57, and last night 92/43. At home this morning 99/55 and 92/44. She obtain blood pressure  after sitting. She has added Celtic sea salt to diet.    No syncope, dizziness, cp.  She will feel a fluttering sensation in chest with heart however which is not associated with cp.  Occurs randomly and she has previously seen cardiology for this.   Cardiology consult Dr Josefa Half 01/01/21 for heart palpitations, shortness of breath.  Holter monitor ordered as well as 2D echocardiogram.  Unable to see results from Holter monitor at that time  in epic.     She was seen at [facility] and recently started on cytomel. She feels better on cytomel.  Echo 12/2020 Est LVEF > 55% Family h/o low blood pressure and low EF.      HISTORY:  Past Medical History:  Diagnosis Date   History of kidney stones    Hyperlipidemia    Squamous cell carcinoma, arm    Followed by Dr. Evorn Gong   Past Surgical History:  Procedure Laterality Date   KIDNEY [place]     surgical removal, Dr. Pearlie Oyster age 43   VAGINAL DELIVERY     2   Family History  Problem Relation Age of Onset   Heart disease Father        AFIB   Esophageal cancer Father 34       widely metastatic; smoker; deceased 72   Lung cancer Paternal Grandfather 69       smoker; deceased 81   Colon cancer Paternal Uncle 75       currently 80   Prostate cancer Paternal Uncle 53        currently 6   Throat cancer Paternal Uncle        non-smoker; currently 22   Cancer Cousin 93       in jaw; daughter of pat uncle with throad ca   Pancreatic cancer Other 40       pat grandfather's sister; non-smoker; deceased 31s   Cancer Other        lung and larynx ca in 2 other sisters of pat grandfather    Allergies: Lexapro [escitalopram oxalate] and Sertraline Current Outpatient Medications on File Prior to Visit  Medication Sig Dispense Refill   cyanocobalamin (,VITAMIN B-12,) 1000 MCG/ML injection INJECT 1 ML (1,000 MCG TOTAL) INTO THE MUSCLE EVERY 14 DAYS 10 mL 1   liothyronine (CYTOMEL) 5 MCG tablet Take 1 tablet by mouth daily.     neomycin-polymyxin-hydrocortisone (CORTISPORIN) 3.5-10000-1 OTIC suspension Place 4 drops into the left ear 4 (four) times daily. 10 mL 0   Syringe/Needle, Disp, (SYRINGE 3CC/25GX1") 25G X 1" 3 ML MISC Use for b12 injections 50 each 0   Testosterone 25 MG PLLT See admin instructions.     traZODone (DESYREL)  50 MG tablet TAKE 1 TO 2 TABLETS BY MOUTH AT BEDTIME AS NEEDED FOR SLEEP 60 tablet 3   valACYclovir (VALTREX) 1000 MG tablet Take 1 tablet (1,000 mg total) by mouth daily. 90 tablet 3   No current facility-administered medications on file prior to visit.    Social History   Tobacco Use   Smoking status: Never   Smokeless tobacco: Never  Substance Use Topics   Alcohol use: No    Alcohol/week: 0.0 standard drinks   Drug use: No    Review of Systems  Constitutional:  Negative for chills and fever.  Respiratory:  Negative for cough and shortness of breath.   Cardiovascular:  Positive for palpitations. Negative for chest pain and leg swelling.  Gastrointestinal:  Negative for nausea and vomiting.  Neurological:  Negative for dizziness and syncope.     Objective:    BP 102/60 (BP Location: Left Arm, Patient Position: Sitting, Cuff Size: Large)    Pulse 73    Temp 97.8 F (36.6 C) (Oral)    Ht 5\' 7"  (1.702 m)    Wt 192 lb 9.6 oz (87.4  kg)    SpO2 97%    BMI 30.17 kg/m  BP Readings from Last 3 Encounters:  07/03/21 102/60  12/19/20 104/66  05/24/20 116/76     Physical Exam Vitals reviewed.  Constitutional:      Appearance: She is well-developed.  Eyes:     Conjunctiva/sclera: Conjunctivae normal.  Cardiovascular:     Rate and Rhythm: Normal rate and regular rhythm.     Pulses: Normal pulses.     Heart sounds: Normal heart sounds.  Pulmonary:     Effort: Pulmonary effort is normal.     Breath sounds: Normal breath sounds. No wheezing, rhonchi or rales.  Skin:    General: Skin is warm and dry.  Neurological:     Mental Status: She is alert.  Psychiatric:        Speech: Speech normal.        Behavior: Behavior normal.        Thought Content: Thought content normal.       Assessment & Plan:   Problem List Items Addressed This Visit       Cardiovascular and Mediastinum   Hypotension - Primary    New ( to me) .BP , particularly DBP, improved in office today. Readings at home most concerning for low diastolic blood pressure.  Unable to see Holter monitor as previously evaluated by cardiology, Dr. Saralyn Pilar.  EKG today shows sinus bradycardia.  No acute changes.  No prior EKG to compare too as unable to obtain through epic. She is not orthostatic (see flowsheet) She will continue to liberalize salt in her diet and I counseled her on the importance of being careful with  position changes, hot showers.    Reviewed cytomel which has been started in the past couple of months by [facility] MD and has less than  <2% chance of side effect of hypotension. We opted not to make a change to cytomel as she has had low end BP for some time. Pending TSH today however along with electrolytes.  Briefly discussed midodrine however advised I would feel most comfortable if we defer that conversation to cardiology as blood pressure in the office today has improved.  referral placed to cardiology. She will let me know how she is feeling  and certainly if she develops further concerns,  becomes symptomatic.      Relevant  Orders   TSH   CBC with Differential/Platelet   Comprehensive metabolic panel   Magnesium   EKG 12-Lead (Completed)   Ambulatory referral to Cardiology      I have discontinued Alexx Giambra. Arman "Danielle"'s fluconazole. I am also having her maintain her SYRINGE 3CC/25GX1", valACYclovir, traZODone, neomycin-polymyxin-hydrocortisone, cyanocobalamin, liothyronine, and Testosterone.   No orders of the defined types were placed in this encounter.   Return precautions given.   Risks, benefits, and alternatives of the medications and treatment plan prescribed today were discussed, and patient expressed understanding.   Education regarding symptom management and diagnosis given to patient on AVS.  Continue to follow with Crecencio Mc, MD for routine health maintenance.   Denzil Hughes and I agreed with plan.   Mable Paris, FNP

## 2021-07-03 NOTE — Assessment & Plan Note (Addendum)
New ( to me) .BP , particularly DBP, improved in office today. Readings at home most concerning for low diastolic blood pressure.  Unable to see Holter monitor as previously evaluated by cardiology, Dr. Saralyn Pilar.  EKG today shows sinus bradycardia.  No acute changes.  No prior EKG to compare too as unable to obtain through epic. She is not orthostatic (see flowsheet) She will continue to liberalize salt in her diet and I counseled her on the importance of being careful with  position changes, hot showers.    Reviewed cytomel which has been started in the past couple of months by [facility] MD and has less than  <2% chance of side effect of hypotension. We opted not to make a change to cytomel as she has had low end BP for some time. Pending TSH today however along with electrolytes.  Briefly discussed midodrine however advised I would feel most comfortable if we defer that conversation to cardiology as blood pressure in the office today has improved.  referral placed to cardiology. She will let me know how she is feeling and certainly if she develops further concerns,  becomes symptomatic.

## 2021-07-14 ENCOUNTER — Other Ambulatory Visit: Payer: Self-pay | Admitting: Family

## 2021-07-23 ENCOUNTER — Other Ambulatory Visit: Payer: Self-pay | Admitting: Internal Medicine

## 2021-08-04 ENCOUNTER — Other Ambulatory Visit: Payer: Self-pay

## 2021-08-04 ENCOUNTER — Ambulatory Visit: Payer: BC Managed Care – PPO | Admitting: Cardiovascular Disease

## 2021-08-04 ENCOUNTER — Encounter: Payer: Self-pay | Admitting: Cardiovascular Disease

## 2021-08-04 VITALS — BP 105/73 | HR 77 | Ht 67.0 in | Wt 189.0 lb

## 2021-08-04 DIAGNOSIS — R001 Bradycardia, unspecified: Secondary | ICD-10-CM

## 2021-08-04 DIAGNOSIS — I959 Hypotension, unspecified: Secondary | ICD-10-CM

## 2021-08-04 DIAGNOSIS — R002 Palpitations: Secondary | ICD-10-CM | POA: Diagnosis not present

## 2021-08-04 NOTE — Patient Instructions (Signed)
Salt and fluid load  ? ?Medication Instructions:  ?No changes ? ?If you need a refill on your cardiac medications before your next appointment, please call your pharmacy.  ? ?Lab work: ?No new labs needed ? ?Testing/Procedures: ?No new testing needed ? ?Follow-Up: ?At [facility], you and your health needs are our priority.  As part of our continuing mission to provide you with exceptional heart care, we have created designated Provider Care Teams.  These Care Teams include your primary Cardiologist (physician) and Advanced Practice Providers (APPs -  Physician Assistants and Nurse Practitioners) who all work together to provide you with the care you need, when you need it. ? ?You will need a follow up appointment as needed ? ?Providers on your designated Care Team:   ?Murray Hodgkins, NP ?Christell Faith, PA-C ?Cadence Kathlen Mody, PA-C ? ?COVID-19 Vaccine Information can be found at: [email or web address] For questions related to vaccine distribution or appointments, please email [email or web address] or call [phone].  ? ?

## 2021-08-04 NOTE — Progress Notes (Signed)
Cardiology Office Note ? ?Date:  08/04/2021  ? ?ID:  MAKENDRA VIGEANT, DOB [birth year], MRN [id number] ? ?PCP:  Crecencio Mc, MD  ? ?Cc: low BP, palpitations ? ?HPI:  ?Ms. Alexis Little is a 46 year old woman with past medical history of ?Palpitations, shortness of breath, previously seen by outside cardiology August 2022 ?Echo  performed (as below) ?Family hx of cardiomyopathy: father, GM, uncles x2, cousing ?Who presents by referral from Mable Paris for consultation of low blood pressure, palpitations ? ?Received seen by primary care, ?Per primary care, "Has recorded blood pressures "90/43. " ?symptomatic with hands, feet becoming very cold.  ? at home 112/64, 104/54, 109/61, 112/57, 92/43. 99/55 and 92/44. ? ?Reports active at baseline, remodeling a house ?Feels that blood pressure typically runs low for many years ?Sometimes heart rate also running low ?Denies orthostasis symptoms even with all her activities ?Weight stable ? ?Echocardiogram performed August 2022 ?Normal study, ejection fraction greater than 55% ? ?Seen by outside cardiology August 2022 ?exertional dyspnea, palpitations, and pedal edema. ? ?EKG personally reviewed by myself on todays visit ?Normal sinus rhythm rate 77 bpm no significant ST-T wave changes ? ?PMH:   has a past medical history of History of kidney stones, Hyperlipidemia, and Squamous cell carcinoma, arm. ? ?PSH:    ?Past Surgical History:  ?Procedure Laterality Date  ? KIDNEY STONE SURGERY    ? KIDNEY STONE SURGERY    ? surgical removal, Dr. Pearlie Oyster age 60  ? VAGINAL DELIVERY    ? 2  ? ? ?Current Outpatient Medications  ?Medication Sig Dispense Refill  ? cyanocobalamin (,VITAMIN B-12,) 1000 MCG/ML injection INJECT 1 ML (1,000 MCG TOTAL) INTO THE MUSCLE EVERY 14 DAYS 10 mL 1  ? liothyronine (CYTOMEL) 5 MCG tablet TALE 1 TABLET BY MOUTH ON AN EMPTY STOMACH IN THE MORNING 90 tablet 1  ? neomycin-polymyxin-hydrocortisone (CORTISPORIN) 3.5-10000-1 OTIC suspension Place 4 drops into  the left ear 4 (four) times daily. 10 mL 0  ? SYRINGE-NEEDLE, DISP, 3 ML (B-D 3CC LUER-LOK SYR 25GX1") 25G X 1" 3 ML MISC USE FOR B12 INJECTIONS 10 each 1  ? Testosterone 25 MG PLLT See admin instructions.    ? valACYclovir (VALTREX) 1000 MG tablet Take 1 tablet (1,000 mg total) by mouth daily. 90 tablet 3  ? ?No current facility-administered medications for this visit.  ? ? ?Allergies:   Lexapro [escitalopram oxalate] and Sertraline  ? ?Social History:  The patient  reports that she has never smoked. She has never used smokeless tobacco. She reports that she does not drink alcohol and does not use drugs.  ? ?Family History:   family history includes Cancer in an other family member; Cancer (age of onset: 15) in her cousin; Colon cancer (age of onset: 34) in her paternal uncle; Esophageal cancer (age of onset: 44) in her father; Heart disease in her father; Lung cancer (age of onset: 24) in her paternal grandfather; Pancreatic cancer (age of onset: 5) in an other family member; Prostate cancer (age of onset: 30) in her paternal uncle; Throat cancer in her paternal uncle.  ? ? ?Review of Systems: ?Review of Systems  ?Constitutional: Negative.   ?HENT: Negative.    ?Respiratory: Negative.    ?Cardiovascular: Negative.   ?Gastrointestinal: Negative.   ?Musculoskeletal: Negative.   ?Neurological: Negative.   ?Psychiatric/Behavioral: Negative.    ?All other systems reviewed and are negative. ? ? ?PHYSICAL EXAM: ?VS:  BP 105/73   Pulse 77   Ht '5\' 7"'$  (1.702  m)   Wt 189 lb (85.7 kg)   SpO2 99%   BMI 29.60 kg/m?  , BMI Body mass index is 29.6 kg/m?. ?GEN: Well nourished, well developed, in no acute distress ?HEENT: normal ?Neck: no JVD, carotid bruits, or masses ?Cardiac: RRR; no murmurs, rubs, or gallops,no edema  ?Respiratory:  clear to auscultation bilaterally, normal work of breathing ?GI: soft, nontender, nondistended, + BS ?MS: no deformity or atrophy ?Skin: warm and dry, no rash ?Neuro:  Strength and sensation  are intact ?Psych: euthymic mood, full affect ? ? ?Recent Labs: ?07/03/2021: ALT 13; BUN 10; Creatinine, Ser 0.80; Hemoglobin 14.8; Magnesium 1.9; Platelets 274.0; Potassium 4.6; Sodium 141; TSH 0.98  ? ? ?Lipid Panel ?Lab Results  ?Component Value Date  ? CHOL 208 (H) 12/19/2020  ? HDL 52.90 12/19/2020  ? LDLCALC 143 (H) 12/19/2020  ? TRIG 57.0 12/19/2020  ? ? ? ?Wt Readings from Last 3 Encounters:  ?08/04/21 189 lb (85.7 kg)  ?07/03/21 192 lb 9.6 oz (87.4 kg)  ?01/28/21 182 lb (82.6 kg)  ?  ? ? ? ?ASSESSMENT AND PLAN: ? ?Problem List Items Addressed This Visit   ? ?  ? Cardiology Problems  ? Hypotension - Primary  ? ?Other Visit Diagnoses   ? ? Bradycardia      ? Palpitations      ? ?  ? ?Hypertension ?Blood pressure low at times, asymptomatic ?Recommended fluid, salt loading ?Discussed compression garments ?Rising slowly from supine position ?For any worsening orthostasis symptoms, recommend she call our office ?We will try to hold off on using medication such as Florinef and midodrine given she is asymptomatic ? ?Bradycardia ?Reason visit, also monitoring of numbers at home ?Likely not a major issue, last documented heart rates in the 70s ?Again asymptomatic, no indication for further work-up ? ?Palpitations ?Possibly APCs, PVCs ?For worsening symptoms could consider Zio monitor ?Best option at this time would be a cardia mobile device at home or Apple Watch ? ?Risk factor modification ?Cholesterol mildly elevated, ?Continue careful diet, exercise program ? ? ? Total encounter time more than 60 minutes ? Greater than 50% was spent in counseling and coordination of care with the patient ? ?Patient seen in consultation for Mable Paris will be referred back to her office for ongoing care of the issues detailed above ? ?Signed, ?Esmond Plants, M.D., Ph.D. ?[facility] [place], [place] ?[phone] ?

## 2021-08-07 DIAGNOSIS — E039 Hypothyroidism, unspecified: Secondary | ICD-10-CM | POA: Diagnosis not present

## 2021-08-07 NOTE — Addendum Note (Signed)
Addended by: Raelene Bott, Ginette Bradway L on: 08/07/2021 12:52 PM ? ? Modules accepted: Orders ? ?

## 2021-08-13 DIAGNOSIS — R5383 Other fatigue: Secondary | ICD-10-CM | POA: Diagnosis not present

## 2021-08-13 DIAGNOSIS — E559 Vitamin D deficiency, unspecified: Secondary | ICD-10-CM | POA: Diagnosis not present

## 2021-08-13 DIAGNOSIS — N951 Menopausal and female climacteric states: Secondary | ICD-10-CM | POA: Diagnosis not present

## 2021-08-13 DIAGNOSIS — L570 Actinic keratosis: Secondary | ICD-10-CM | POA: Diagnosis not present

## 2021-08-13 DIAGNOSIS — Z85828 Personal history of other malignant neoplasm of skin: Secondary | ICD-10-CM | POA: Diagnosis not present

## 2021-08-13 DIAGNOSIS — D2262 Melanocytic nevi of left upper limb, including shoulder: Secondary | ICD-10-CM | POA: Diagnosis not present

## 2021-08-13 DIAGNOSIS — L308 Other specified dermatitis: Secondary | ICD-10-CM | POA: Diagnosis not present

## 2021-08-13 DIAGNOSIS — D2271 Melanocytic nevi of right lower limb, including hip: Secondary | ICD-10-CM | POA: Diagnosis not present

## 2021-08-13 DIAGNOSIS — E038 Other specified hypothyroidism: Secondary | ICD-10-CM | POA: Diagnosis not present

## 2021-08-18 DIAGNOSIS — Z6828 Body mass index (BMI) 28.0-28.9, adult: Secondary | ICD-10-CM | POA: Diagnosis not present

## 2021-08-18 DIAGNOSIS — N926 Irregular menstruation, unspecified: Secondary | ICD-10-CM | POA: Diagnosis not present

## 2021-08-18 DIAGNOSIS — N951 Menopausal and female climacteric states: Secondary | ICD-10-CM | POA: Diagnosis not present

## 2021-08-18 DIAGNOSIS — R61 Generalized hyperhidrosis: Secondary | ICD-10-CM | POA: Diagnosis not present

## 2021-10-07 ENCOUNTER — Other Ambulatory Visit: Payer: Self-pay | Admitting: Family

## 2021-10-07 NOTE — Telephone Encounter (Signed)
REFILL SENT 

## 2021-11-16 DIAGNOSIS — N951 Menopausal and female climacteric states: Secondary | ICD-10-CM | POA: Diagnosis not present

## 2021-11-16 DIAGNOSIS — E038 Other specified hypothyroidism: Secondary | ICD-10-CM | POA: Diagnosis not present

## 2021-11-18 DIAGNOSIS — Z6827 Body mass index (BMI) 27.0-27.9, adult: Secondary | ICD-10-CM | POA: Diagnosis not present

## 2021-11-18 DIAGNOSIS — G479 Sleep disorder, unspecified: Secondary | ICD-10-CM | POA: Diagnosis not present

## 2021-11-18 DIAGNOSIS — N951 Menopausal and female climacteric states: Secondary | ICD-10-CM | POA: Diagnosis not present

## 2021-11-18 DIAGNOSIS — R232 Flushing: Secondary | ICD-10-CM | POA: Diagnosis not present

## 2022-03-17 DIAGNOSIS — Z1231 Encounter for screening mammogram for malignant neoplasm of breast: Secondary | ICD-10-CM | POA: Diagnosis not present

## 2022-03-17 LAB — HM MAMMOGRAPHY

## 2022-03-26 DIAGNOSIS — R928 Other abnormal and inconclusive findings on diagnostic imaging of breast: Secondary | ICD-10-CM | POA: Diagnosis not present

## 2022-05-31 ENCOUNTER — Other Ambulatory Visit (HOSPITAL_COMMUNITY)
Admission: RE | Admit: 2022-05-31 | Discharge: 2022-05-31 | Disposition: A | Discharge status: Home / Self Care | Payer: No Typology Code available for payment source | Source: Ambulatory Visit | Attending: Internal Medicine | Admitting: Internal Medicine

## 2022-05-31 ENCOUNTER — Ambulatory Visit (INDEPENDENT_AMBULATORY_CARE_PROVIDER_SITE_OTHER): Payer: No Typology Code available for payment source | Admitting: Internal Medicine

## 2022-05-31 ENCOUNTER — Encounter: Payer: Self-pay | Admitting: Internal Medicine

## 2022-05-31 VITALS — BP 118/74 | HR 73 | Temp 97.9°F | Ht 67.0 in | Wt 174.6 lb

## 2022-05-31 DIAGNOSIS — E663 Overweight: Secondary | ICD-10-CM | POA: Diagnosis not present

## 2022-05-31 DIAGNOSIS — Z0000 Encounter for general adult medical examination without abnormal findings: Secondary | ICD-10-CM | POA: Diagnosis not present

## 2022-05-31 DIAGNOSIS — N951 Menopausal and female climacteric states: Secondary | ICD-10-CM

## 2022-05-31 DIAGNOSIS — F5105 Insomnia due to other mental disorder: Secondary | ICD-10-CM

## 2022-05-31 DIAGNOSIS — R7301 Impaired fasting glucose: Secondary | ICD-10-CM | POA: Diagnosis not present

## 2022-05-31 DIAGNOSIS — R5383 Other fatigue: Secondary | ICD-10-CM | POA: Diagnosis not present

## 2022-05-31 DIAGNOSIS — R5382 Chronic fatigue, unspecified: Secondary | ICD-10-CM | POA: Diagnosis not present

## 2022-05-31 DIAGNOSIS — E538 Deficiency of other specified B group vitamins: Secondary | ICD-10-CM

## 2022-05-31 DIAGNOSIS — F419 Anxiety disorder, unspecified: Secondary | ICD-10-CM

## 2022-05-31 DIAGNOSIS — N766 Ulceration of vulva: Secondary | ICD-10-CM

## 2022-05-31 DIAGNOSIS — Z124 Encounter for screening for malignant neoplasm of cervix: Secondary | ICD-10-CM | POA: Diagnosis present

## 2022-05-31 DIAGNOSIS — Z1211 Encounter for screening for malignant neoplasm of colon: Secondary | ICD-10-CM

## 2022-05-31 DIAGNOSIS — B009 Herpesviral infection, unspecified: Secondary | ICD-10-CM

## 2022-05-31 DIAGNOSIS — I959 Hypotension, unspecified: Secondary | ICD-10-CM

## 2022-05-31 DIAGNOSIS — R238 Other skin changes: Secondary | ICD-10-CM

## 2022-05-31 DIAGNOSIS — N6002 Solitary cyst of left breast: Secondary | ICD-10-CM

## 2022-05-31 DIAGNOSIS — Z79890 Hormone replacement therapy (postmenopausal): Secondary | ICD-10-CM

## 2022-05-31 NOTE — Progress Notes (Unsigned)
Patient ID: Alexis Little, female    DOB: [birth year]  Age: 47 y.o. MRN: [id number]  The patient is here for annual preventive examination and management of other chronic and acute problems.  Last OV July 2022    The risk factors are reflected in the social history.   The roster of all physicians providing medical care to patient - is listed in the Snapshot section of the chart.   Activities of daily living:  The patient is 100% independent in all ADLs: dressing, toileting, feeding as well as independent mobility   Home safety : The patient has smoke detectors in the home. They wear seatbelts.  There are no unsecured firearms at home. There is no violence in the home.    There is no risks for hepatitis, STDs or HIV. There is no   history of blood transfusion. They have no travel history to infectious disease endemic areas of the world.   The patient has seen their dentist in the last six month. They have seen their eye doctor in the last year. The patinet  denies slight hearing difficulty with regard to whispered voices and some television programs.  They have deferred audiologic testing in the last year.  They do not  have excessive sun exposure. Discussed the need for sun protection: hats, long sleeves and use of sunscreen if there is significant sun exposure.    Diet: the importance of a healthy diet is discussed. They do have a healthy diet.   The benefits of regular aerobic exercise were discussed. The patient  exercises  3 to 5 days per week  for  60 minutes.    Depression screen: there are no signs or vegative symptoms of depression- irritability, change in appetite, anhedonia, sadness/tearfullness.   The following portions of the patient's history were reviewed and updated as appropriate: allergies, current medications, past family history, past medical history,  past surgical history, past social history  and problem list.   Visual acuity was not assessed per patient preference  since the patient has regular follow up with an  ophthalmologist. Hearing and body mass index were assessed and reviewed.    During the course of the visit the patient was educated and counseled about appropriate screening and preventive services including : fall prevention , diabetes screening, nutrition counseling, colorectal cancer screening, and recommended immunizations.    Chief Complaint:  none   Review of Symptoms  Patient denies headache, fevers, malaise, unintentional weight loss, skin rash, eye pain, sinus congestion and sinus pain, sore throat, dysphagia,  hemoptysis , cough, dyspnea, wheezing, chest pain, palpitations, orthopnea, edema, abdominal pain, nausea, melena, diarrhea, constipation, flank pain, dysuria, hematuria, urinary  Frequency, nocturia, numbness, tingling, seizures,  Focal weakness, Loss of consciousness,  Tremor, insomnia, depression, anxiety, and suicidal ideation.    Physical Exam:  BP 118/74   Pulse 73   Temp 97.9 F (36.6 C) (Oral)   Ht '5\' 7"'$  (1.702 m)   Wt 174 lb 9.6 oz (79.2 kg)   SpO2 98%   BMI 27.35 kg/m    Physical Exam Vitals reviewed.  Constitutional:      General: She is not in acute distress.    Appearance: Normal appearance. She is well-developed and normal weight. She is not ill-appearing, toxic-appearing or diaphoretic.  HENT:     Head: Normocephalic.     Right Ear: Tympanic membrane, ear canal and external ear normal. There is no impacted cerumen.     Left Ear: Tympanic membrane, ear  canal and external ear normal. There is no impacted cerumen.     Nose: Nose normal.     Mouth/Throat:     Mouth: Mucous membranes are moist.     Pharynx: Oropharynx is clear.  Eyes:     General: No scleral icterus.       Right eye: No discharge.        Left eye: No discharge.     Conjunctiva/sclera: Conjunctivae normal.     Pupils: Pupils are equal, round, and reactive to light.  Neck:     Thyroid: No thyromegaly.     Vascular: No carotid bruit  or JVD.  Cardiovascular:     Rate and Rhythm: Normal rate and regular rhythm.     Heart sounds: Normal heart sounds.  Pulmonary:     Effort: Pulmonary effort is normal. No respiratory distress.     Breath sounds: Normal breath sounds.  Chest:  Breasts:    Breasts are symmetrical.     Right: Normal. No swelling, inverted nipple, mass, nipple discharge, skin change or tenderness.     Left: Normal. No swelling, inverted nipple, mass, nipple discharge, skin change or tenderness.  Abdominal:     General: Bowel sounds are normal.     Palpations: Abdomen is soft. There is no mass.     Tenderness: There is no abdominal tenderness. There is no guarding or rebound.     Hernia: There is no hernia in the left inguinal area or right inguinal area.  Genitourinary:    Exam position: Lithotomy position.     Pubic Area: No rash or pubic lice.      Labia:        Right: No rash, tenderness, lesion or injury.        Left: No rash, tenderness, lesion or injury.      Vagina: Normal.     Cervix: Normal.     Uterus: Normal.      Adnexa: Right adnexa normal and left adnexa normal.  Musculoskeletal:        General: Normal range of motion.     Cervical back: Normal range of motion and neck supple.  Lymphadenopathy:     Cervical: No cervical adenopathy.     Upper Body:     Right upper body: No supraclavicular, axillary or pectoral adenopathy.     Left upper body: No supraclavicular, axillary or pectoral adenopathy.     Lower Body: No right inguinal adenopathy. No left inguinal adenopathy.  Skin:    General: Skin is warm and dry.  Neurological:     General: No focal deficit present.     Mental Status: She is alert and oriented to person, place, and time. Mental status is at baseline.  Psychiatric:        Mood and Affect: Mood normal.        Behavior: Behavior normal.        Thought Content: Thought content normal.        Judgment: Judgment normal.     Assessment and Plan: Routine general medical  examination at a health care facility Assessment & Plan: age appropriate education and counseling updated, referrals for preventative services and immunizations addressed, dietary and smoking counseling addressed, breast , pelvic exams and PAP smear done, and most  recent labs reviewed.  I have personally reviewed and have noted:   1) the patient's medical and social history 2) The pt's use of alcohol, tobacco, and illicit drugs 3) The patient's current medications and  supplements 4) Functional ability including ADL's, fall risk, home safety risk, hearing and visual impairment 5) Diet and physical activities 6) Evidence for depression or mood disorder 7) The patient's height, weight, and BMI have been recorded in the chart  I have made referrals, and provided counseling and education based on review of the above    Cervical cancer screening -     Cytology - PAP  Overweight (BMI 25.0-29.9) -     Lipid panel -     LDL cholesterol, direct -     Comprehensive metabolic panel  Chronic fatigue -     CBC with Differential/Platelet -     TSH  Impaired fasting glucose -     Comprehensive metabolic panel -     Hemoglobin A1c  Colon cancer screening -     Cologuard  Vesicular rash Assessment & Plan: Recurrent,  on left buttock.  HSV 2 positive screen.  Will treat for outbreak  with valacyclovir 1000 mg daily x 5 days,  then 500 mg daily  for suppressive dose.   Orders: -     HSV(herpes simplex vrs) 1+2 ab-IgG  Fatigue, unspecified type -     Vitamin B12 -     IBC + Ferritin  Cyst of left breast Assessment & Plan: Left breast,  by diagnostic mamm[facility],  6 MONTH follow up due in May 2024   B12 deficiency Assessment & Plan: Managed with monthly b12 injections     Hypotension, unspecified hypotension type Assessment & Plan: Occurring premenstrually, managed with increased dietary sodium    Perimenopause Assessment & Plan: Patient is receiving hormone therapy  with testosterone pellet implantation by [facility]   Hormone replacement therapy (HRT) Assessment & Plan: Receiving testosterone pellet implantation by [facility]    Insomnia secondary to anxiety Assessment & Plan: Chronic, with no improvement using over-the-counter first generation antihistamines. Reviewed principles of good sleep hygiene. Continue use of trazodone    HSV-2 (herpes simplex virus 2) infection Assessment & Plan: Recurrent,  on left buttock.  HSV 2 positive screen.  Will treat for outbreak  with valacyclovir 1000 mg daily x 5 days,  then 500 mg daily  for suppressive dose.    Other orders -     valACYclovir HCl; Take 2 tablets (1,000 mg total) by mouth daily. For 5 days ,  then once daily thereafter  Dispense: 35 tablet; Refill: 5    Return in about 3 months (around 08/30/2022).  Crecencio Mc, MD

## 2022-06-01 DIAGNOSIS — Z79890 Hormone replacement therapy (postmenopausal): Secondary | ICD-10-CM | POA: Insufficient documentation

## 2022-06-01 DIAGNOSIS — N951 Menopausal and female climacteric states: Secondary | ICD-10-CM | POA: Insufficient documentation

## 2022-06-01 LAB — CBC WITH DIFFERENTIAL/PLATELET
Basophils Absolute: 0.1 10*3/uL (ref 0.0–0.1)
Basophils Relative: 0.9 % (ref 0.0–3.0)
Eosinophils Absolute: 0.2 10*3/uL (ref 0.0–0.7)
Eosinophils Relative: 2.9 % (ref 0.0–5.0)
HCT: 42.1 % (ref 36.0–46.0)
Hemoglobin: 14.2 g/dL (ref 12.0–15.0)
Lymphocytes Relative: 24.4 % (ref 12.0–46.0)
Lymphs Abs: 1.8 10*3/uL (ref 0.7–4.0)
MCHC: 33.6 g/dL (ref 30.0–36.0)
MCV: 89.4 fl (ref 78.0–100.0)
Monocytes Absolute: 0.6 10*3/uL (ref 0.1–1.0)
Monocytes Relative: 7.8 % (ref 3.0–12.0)
Neutro Abs: 4.6 10*3/uL (ref 1.4–7.7)
Neutrophils Relative %: 64 % (ref 43.0–77.0)
Platelets: 301 10*3/uL (ref 150.0–400.0)
RBC: 4.71 Mil/uL (ref 3.87–5.11)
RDW: 12.7 % (ref 11.5–15.5)
WBC: 7.2 10*3/uL (ref 4.0–10.5)

## 2022-06-01 LAB — COMPREHENSIVE METABOLIC PANEL
ALT: 12 U/L (ref 0–35)
AST: 14 U/L (ref 0–37)
Albumin: 4.3 g/dL (ref 3.5–5.2)
Alkaline Phosphatase: 48 U/L (ref 39–117)
BUN: 12 mg/dL (ref 6–23)
CO2: 28 mEq/L (ref 19–32)
Calcium: 9.1 mg/dL (ref 8.4–10.5)
Chloride: 102 mEq/L (ref 96–112)
Creatinine, Ser: 0.76 mg/dL (ref 0.40–1.20)
GFR: 92.87 mL/min (ref >60.00)
Glucose, Bld: 88 mg/dL (ref 70–99)
Potassium: 4 mEq/L (ref 3.5–5.1)
Sodium: 137 mEq/L (ref 135–145)
Total Bilirubin: 0.5 mg/dL (ref 0.2–1.2)
Total Protein: 6.7 g/dL (ref 6.0–8.3)

## 2022-06-01 LAB — IBC + FERRITIN
Ferritin: 36 ng/mL (ref 10.0–291.0)
Iron: 64 ug/dL (ref 42–145)
Saturation Ratios: 21.3 % (ref 20.0–50.0)
TIBC: 301 ug/dL (ref 250.0–450.0)
Transferrin: 215 mg/dL (ref 212.0–360.0)

## 2022-06-01 LAB — LIPID PANEL
Cholesterol: 193 mg/dL (ref 0–200)
HDL: 54.5 mg/dL (ref >39.00)
LDL Cholesterol: 121 mg/dL — ABNORMAL HIGH (ref 0–99)
NonHDL: 138.08
Total CHOL/HDL Ratio: 4
Triglycerides: 86 mg/dL (ref 0.0–149.0)
VLDL: 17.2 mg/dL (ref 0.0–40.0)

## 2022-06-01 LAB — TSH: TSH: 0.74 u[IU]/mL (ref 0.35–5.50)

## 2022-06-01 LAB — VITAMIN B12: Vitamin B-12: 350 pg/mL (ref 211–911)

## 2022-06-01 LAB — LDL CHOLESTEROL, DIRECT: Direct LDL: 122 mg/dL

## 2022-06-01 LAB — HEMOGLOBIN A1C: Hgb A1c MFr Bld: 4.8 % (ref 4.6–6.5)

## 2022-06-01 NOTE — Assessment & Plan Note (Signed)
Occurring premenstrually, managed with increased dietary sodium

## 2022-06-01 NOTE — Assessment & Plan Note (Signed)
Recurrent,  on left buttock.  HSV 2 positive screen.  Will treat for outbreak  with valacyclovir 1000 mg daily x 5 days,  then 500 mg daily  for suppressive dose.

## 2022-06-01 NOTE — Assessment & Plan Note (Signed)
Receiving testosterone pellet implantation by [facility]

## 2022-06-01 NOTE — Assessment & Plan Note (Signed)
Patient is receiving hormone therapy with testosterone pellet implantation by [facility]

## 2022-06-01 NOTE — Assessment & Plan Note (Addendum)
Left breast,  by diagnostic mammogram Nov 2023 [facility],  6 MONTH follow up due in May 2024

## 2022-06-01 NOTE — Assessment & Plan Note (Signed)
age appropriate education and counseling updated, referrals for preventative services and immunizations addressed, dietary and smoking counseling addressed, breast , pelvic exams and PAP smear done, and most  recent labs reviewed.  I have personally reviewed and have noted:   1) the patient's medical and social history 2) The pt's use of alcohol, tobacco, and illicit drugs 3) The patient's current medications and supplements 4) Functional ability including ADL's, fall risk, home safety risk, hearing and visual impairment 5) Diet and physical activities 6) Evidence for depression or mood disorder 7) The patient's height, weight, and BMI have been recorded in the chart  I have made referrals, and provided counseling and education based on review of the above

## 2022-06-01 NOTE — Assessment & Plan Note (Signed)
Chronic, with no improvement using over-the-counter first generation antihistamines. Reviewed principles of good sleep hygiene. Continue use of trazodone

## 2022-06-01 NOTE — Assessment & Plan Note (Signed)
Managed with monthly b12 injections

## 2022-06-03 LAB — HSV(HERPES SIMPLEX VRS) I + II AB-IGG
HAV 1 IGG,TYPE SPECIFIC AB: <0.9 index
HSV 2 IGG,TYPE SPECIFIC AB: >23 index — ABNORMAL HIGH

## 2022-06-03 MED ORDER — VALACYCLOVIR HCL 500 MG PO TABS: 1000.0000 mg | ORAL_TABLET | Freq: Every day | ORAL | 5 refills | Status: AC

## 2022-06-03 NOTE — Addendum Note (Signed)
Addended by: Crecencio Mc on: 06/03/2022 03:45 PM   Modules accepted: Orders

## 2022-06-07 LAB — CYTOLOGY - PAP
Comment: NEGATIVE
Diagnosis: NEGATIVE
High risk HPV: NEGATIVE

## 2022-06-27 LAB — COLOGUARD: COLOGUARD: NEGATIVE

## 2022-09-06 ENCOUNTER — Ambulatory Visit: Payer: BC Managed Care – PPO | Admitting: Internal Medicine

## 2023-03-11 ENCOUNTER — Encounter: Payer: Self-pay | Admitting: Family

## 2023-03-11 ENCOUNTER — Ambulatory Visit: Payer: No Typology Code available for payment source | Admitting: Family

## 2023-03-11 VITALS — BP 128/80 | HR 74 | Temp 97.4°F | Ht 67.5 in | Wt 183.0 lb

## 2023-03-11 DIAGNOSIS — R1013 Epigastric pain: Secondary | ICD-10-CM | POA: Diagnosis not present

## 2023-03-11 LAB — COMPREHENSIVE METABOLIC PANEL
ALT: 10 U/L (ref 0–35)
AST: 12 U/L (ref 0–37)
Albumin: 4.1 g/dL (ref 3.5–5.2)
Alkaline Phosphatase: 52 U/L (ref 39–117)
BUN: 9 mg/dL (ref 6–23)
CO2: 30 meq/L (ref 19–32)
Calcium: 9.2 mg/dL (ref 8.4–10.5)
Chloride: 105 meq/L (ref 96–112)
Creatinine, Ser: 0.78 mg/dL (ref 0.40–1.20)
GFR: 89.53 mL/min (ref >60.00)
Glucose, Bld: 77 mg/dL (ref 70–99)
Potassium: 4.5 meq/L (ref 3.5–5.1)
Sodium: 141 meq/L (ref 135–145)
Total Bilirubin: 0.8 mg/dL (ref 0.2–1.2)
Total Protein: 6.1 g/dL (ref 6.0–8.3)

## 2023-03-11 LAB — URINALYSIS, ROUTINE W REFLEX MICROSCOPIC
Bilirubin Urine: NEGATIVE
Hgb urine dipstick: NEGATIVE
Ketones, ur: NEGATIVE
Leukocytes,Ua: NEGATIVE
Nitrite: NEGATIVE
Specific Gravity, Urine: 1.02 (ref 1.000–1.030)
Total Protein, Urine: NEGATIVE
Urine Glucose: NEGATIVE
Urobilinogen, UA: 0.2 (ref 0.0–1.0)
pH: 7 (ref 5.0–8.0)

## 2023-03-11 LAB — CBC WITH DIFFERENTIAL/PLATELET
Basophils Absolute: 0.1 10*3/uL (ref 0.0–0.1)
Basophils Relative: 1.1 % (ref 0.0–3.0)
Eosinophils Absolute: 0.2 10*3/uL (ref 0.0–0.7)
Eosinophils Relative: 4.6 % (ref 0.0–5.0)
HCT: 45.4 % (ref 36.0–46.0)
Hemoglobin: 14.8 g/dL (ref 12.0–15.0)
Lymphocytes Relative: 22.8 % (ref 12.0–46.0)
Lymphs Abs: 1.1 10*3/uL (ref 0.7–4.0)
MCHC: 32.5 g/dL (ref 30.0–36.0)
MCV: 91.1 fL (ref 78.0–100.0)
Monocytes Absolute: 0.3 10*3/uL (ref 0.1–1.0)
Monocytes Relative: 7.1 % (ref 3.0–12.0)
Neutro Abs: 3.1 10*3/uL (ref 1.4–7.7)
Neutrophils Relative %: 64.4 % (ref 43.0–77.0)
Platelets: 253 10*3/uL (ref 150.0–400.0)
RBC: 4.98 Mil/uL (ref 3.87–5.11)
RDW: 12.4 % (ref 11.5–15.5)
WBC: 4.8 10*3/uL (ref 4.0–10.5)

## 2023-03-11 LAB — AMYLASE: Amylase: 26 U/L — ABNORMAL LOW (ref 27–131)

## 2023-03-11 LAB — LIPASE: Lipase: 19 U/L (ref 11.0–59.0)

## 2023-03-11 MED ORDER — PANTOPRAZOLE SODIUM 20 MG PO TBEC
20.0000 mg | DELAYED_RELEASE_TABLET | ORAL | 1 refills | Status: DC
Start: 2023-03-11 — End: 2023-03-11

## 2023-03-11 MED ORDER — PANTOPRAZOLE SODIUM 20 MG PO TBEC
20.0000 mg | DELAYED_RELEASE_TABLET | ORAL | 1 refills | Status: DC
Start: 2023-03-11 — End: 2023-03-18

## 2023-03-11 NOTE — Progress Notes (Signed)
Assessment & Plan:  Epigastric pain Assessment & Plan: Patient afebrile and nontoxic on exam.  Reassuring exam.  no abdominal pain today.  She is burping throughout our office visit.  Sporadic discomfort throughout her exam today to left scapular region however unable to reproduce this pain on exam.  Diarrhea resolved, less likely of infectious etiology.  Differential includes gastritis, GERD, cholelithiasis. With constellation of symptoms, patient and I agree more likely to be GI in nature versus musculoskeletal.  We agreed to start Protonix 20 -40 mg qam.  She politely declines imaging at this time.  We discussed proceeding with right upper quadrant ultrasound or CT abdomen and pelvis if symptoms were to persist.  Pending labs, urine analysis.  Strict return precautions and patient understands if pain were to worsen or new symptoms present over the weekend she would need to report to nearest emergency room.  Close follow-up  Orders: -     H. pylori breath test -     Celiac Disease Panel -     CBC with Differential/Platelet -     Amylase -     Lipase -     Urinalysis, Routine w reflex microscopic -     GI pathogen panel by PCR, stool; Future -     Comprehensive metabolic panel -     C Difficile Quick Screen w PCR reflex; Future -     Urine Culture -     Pantoprazole Sodium; Take 1-2 tablets (20-40 mg total) by mouth every morning. Take 30 minutes to hour before breakfast  Dispense: 30 tablet; Refill: 1     Return precautions given.   Risks, benefits, and alternatives of the medications and treatment plan prescribed today were discussed, and patient expressed understanding.   Education regarding symptom management and diagnosis given to patient on AVS either electronically or printed.  Return in about 1 week (around 03/18/2023).  Alexis Plowman, FNP  Subjective:    Patient ID: Alexis Little, female    DOB: 1976/01/27, 47 y.o.   MRN: [id number]  CC: Alexis Little is a 47 y.o.  female who presents today for an acute visit.    HPI: Complains of excessive burping 6 days ago  Symptoms started with epigastric pain when leaving football game, epigastric pain  resolved with warm wash cloth on her abdomen.  Hours later, she had nonbloody diarrhea twice. She has  had intermittent loose brown diarrhea twice daily, a couple of days this week.  No diarrhea or abdominal pain today.  Epigastric pain improves with eating.   She takes miralax and coffee usually daily to prevent constipation, however she suspended both this past week with epigastric pain.  Constipation is well controlled with MiraLAX and coffee.   She has tried tums, baking soda, beeno without relief.   She did question if sweet tea at football game is at all contributory is on 2 separate occasions she has had epigastric pain and diarrhea after sweet tea.  No alcohol.   Tomatoes and lactose aggravates epigastric pain.   She currently eats gluten.   She also complains of left shoulder blade 'sharp' pains  x 2 days, intermittent Burping relieves the pain.Pain may improve with laying still. Leaning forward aggravates.   No injury, lifting anything heavy.  No associated dysuria, fever, N, v, hematuria, neck pain, rash, fever.    She is receiving testosterone pellet implantation [facility] She receives monthly B12 injections.  Left diagnostic mammogram Alexis Little 08/24/2022, repeat in  6 monthsLeft diagnostic mammogram scheduled in November.   Up-to-date Cologuard  H/o SCC  History of kidney stone at age 32  Non smoker  No recent abx  No nsaids  She reports known history of acid reflux.    Ultrasound complete 01/31/2022 without gallstones  Allergies: Lexapro [escitalopram oxalate] and Sertraline Current Outpatient Medications on File Prior to Visit  Medication Sig Dispense Refill   DODEX 1000 MCG/ML injection INJECT 1 ML (1,000 MCG TOTAL) INTO THE MUSCLE EVERY 14 DAYS 10 mL 1    SYRINGE-NEEDLE, DISP, 3 ML (B-D 3CC LUER-LOK SYR 25GX1") 25G X 1" 3 ML MISC USE FOR B12 INJECTIONS 10 each 1   Testosterone 25 MG PLLT See admin instructions.     thyroid (ARMOUR) 30 MG tablet Take 30 mg by mouth daily before breakfast.     valACYclovir (VALTREX) 500 MG tablet Take 2 tablets (1,000 mg total) by mouth daily. For 5 days ,  then once daily thereafter 35 tablet 5   No current facility-administered medications on file prior to visit.    Review of Systems  Constitutional:  Negative for chills and fever.  Respiratory:  Negative for cough.   Cardiovascular:  Negative for chest pain and palpitations.  Gastrointestinal:  Positive for abdominal pain (epigastric), constipation (chronic) and diarrhea. Negative for nausea and vomiting.  Musculoskeletal:  Positive for back pain (scapular).      Objective:    BP 128/80   Pulse 74   Temp (!) 97.4 F (36.3 C) (Oral)   Ht 5' 7.5" (1.715 m)   Wt 183 lb (83 kg)   SpO2 99%   BMI 28.24 kg/m   BP Readings from Last 3 Encounters:  03/11/23 128/80  05/31/22 118/74  08/04/21 105/73   Wt Readings from Last 3 Encounters:  03/11/23 183 lb (83 kg)  05/31/22 174 lb 9.6 oz (79.2 kg)  08/04/21 189 lb (85.7 kg)    Physical Exam Vitals reviewed.  Constitutional:      Appearance: Normal appearance. She is well-developed.  Eyes:     Conjunctiva/sclera: Conjunctivae normal.  Cardiovascular:     Rate and Rhythm: Normal rate and regular rhythm.     Pulses: Normal pulses.     Heart sounds: Normal heart sounds.  Pulmonary:     Effort: Pulmonary effort is normal.     Breath sounds: Normal breath sounds. No wheezing, rhonchi or rales.  Abdominal:     General: Bowel sounds are normal. There is no distension.     Palpations: Abdomen is soft. Abdomen is not rigid. There is no fluid wave or mass.     Tenderness: There is no abdominal tenderness. There is no right CVA tenderness, left CVA tenderness, guarding or rebound. Negative signs include  Murphy's sign and McBurney's sign.     Comments: Abdomen is soft, nontender.  Negative heel jar test  Musculoskeletal:     Right shoulder: No swelling or bony tenderness. Normal range of motion.     Left shoulder: No swelling or bony tenderness. Normal range of motion.       Arms:     Thoracic back: No spasms, tenderness or bony tenderness.       Back:     Comments: Focal area of pain as described by patient left scapular blade.  No pain on exam.  Unable to reproduce pain with exam.  No pain with deep inspiration.  No rash.  Skin:    General: Skin is warm and dry.  Neurological:  Mental Status: She is alert.  Psychiatric:        Speech: Speech normal.        Behavior: Behavior normal.        Thought Content: Thought content normal.

## 2023-03-11 NOTE — Assessment & Plan Note (Addendum)
Patient afebrile and nontoxic on exam.  Reassuring exam.  no abdominal pain today.  She is burping throughout our office visit.  Sporadic discomfort throughout her exam today to left scapular region however unable to reproduce this pain on exam.  Diarrhea resolved, less likely of infectious etiology.  Differential includes gastritis, GERD, cholelithiasis. With constellation of symptoms, patient and I agree more likely to be GI in nature versus musculoskeletal.  We agreed to start Protonix 20 -40 mg qam.  She politely declines imaging at this time.  We discussed proceeding with right upper quadrant ultrasound or CT abdomen and pelvis if symptoms were to persist.  Pending labs, urine analysis.  Strict return precautions and patient understands if pain were to worsen or new symptoms present over the weekend she would need to report to nearest emergency room.  Close follow-up

## 2023-03-11 NOTE — Patient Instructions (Signed)
Please protonix 20 to 40 mg every morning.  Please take 30 minutes to 1 hour prior to eating breakfast or drinking coffee.   Please monitor abdominal pain and if you develop worsening abdominal pain fever or chills, please report to emergency room over the weekend.   I suspect acid reflux to be playing a role.  I do certainly question gallbladder disease.  Please let me know how you are doing

## 2023-03-12 LAB — URINE CULTURE
MICRO NUMBER:: 15613813
SPECIMEN QUALITY:: ADEQUATE

## 2023-03-14 ENCOUNTER — Encounter: Payer: Self-pay | Admitting: Family

## 2023-03-14 ENCOUNTER — Telehealth: Payer: Self-pay | Admitting: Internal Medicine

## 2023-03-14 DIAGNOSIS — R1013 Epigastric pain: Secondary | ICD-10-CM

## 2023-03-14 LAB — CELIAC DISEASE PANEL
(tTG) Ab, IgA: <1 U/mL
(tTG) Ab, IgG: <1 U/mL
Gliadin IgA: <1 U/mL
Gliadin IgG: <1 U/mL
Immunoglobulin A: 60 mg/dL (ref 47–310)

## 2023-03-14 NOTE — Telephone Encounter (Signed)
Sent to Dr Darrick Huntsman in error.  Pt was Alexis Little recently

## 2023-03-14 NOTE — Telephone Encounter (Signed)
  Rasheedah Can we order ASAP CT abdominal pelvis.  Patient is traveling can we schedule this morning of 03/16/2023.    I spoke with pt  Pain is not worse. She is heading to [facility] [place] this morning for 2 days.   She continues to have mid left scapular pain.  Scapular pain is relieved when lays on left side. No with pain with palpation.   Improves with burping.   She has epigastric pain during the night, improves and then resolves with burping  She is compliant protonix 20mg  however no improvement in symptoms.   No fever, dysuria, N, V, diarrhea.     Plan:   Proceed with stat CT Ab/ p Increase protonix to 40mg  qam Start pepcid ac 20mg  otc at evening/bedtime.   She is out of town; she needs to sch CT a/p 03/16/23.   Strict precautions that if abdominal pain were to worsen while she is traveling or new symptoms develop, she must report to nearest emergency room for in person evaluation, imaging.  Patient verbalized understanding

## 2023-03-14 NOTE — Telephone Encounter (Signed)
Lft pt vm to call ofc to sch ct.thanks

## 2023-03-15 ENCOUNTER — Ambulatory Visit
Admission: RE | Admit: 2023-03-15 | Discharge: 2023-03-15 | Disposition: A | Discharge status: Home / Self Care | Payer: No Typology Code available for payment source | Source: Ambulatory Visit | Attending: Family | Admitting: Family

## 2023-03-15 DIAGNOSIS — R1013 Epigastric pain: Secondary | ICD-10-CM | POA: Insufficient documentation

## 2023-03-15 MED ORDER — IOHEXOL 300 MG/ML  SOLN
100.0000 mL | Freq: Once | INTRAMUSCULAR | Status: AC | PRN
Start: 2023-03-15 — End: 2023-03-15
  Administered 2023-03-15: 100 mL via INTRAVENOUS

## 2023-03-16 ENCOUNTER — Encounter: Payer: Self-pay | Admitting: Family

## 2023-03-16 ENCOUNTER — Ambulatory Visit: Payer: No Typology Code available for payment source | Admitting: Family

## 2023-03-16 ENCOUNTER — Telehealth: Payer: Self-pay | Admitting: Family

## 2023-03-16 VITALS — BP 128/86 | HR 76 | Temp 97.7°F | Ht 67.5 in | Wt 185.6 lb

## 2023-03-16 DIAGNOSIS — N83202 Unspecified ovarian cyst, left side: Secondary | ICD-10-CM

## 2023-03-16 DIAGNOSIS — R1013 Epigastric pain: Secondary | ICD-10-CM | POA: Diagnosis not present

## 2023-03-16 DIAGNOSIS — N83201 Unspecified ovarian cyst, right side: Secondary | ICD-10-CM

## 2023-03-16 LAB — H. PYLORI BREATH TEST: H. pylori Breath Test: NOT DETECTED

## 2023-03-16 NOTE — Patient Instructions (Signed)
I have ordered an ultrasound to further evaluate ovarian cyst.  Let us know if you dont hear back within a week in regards to an appointment being scheduled.   So that you are aware, if you are [facility] MyChart user , please pay attention to your MyChart messages as you may receive a MyChart message with a phone number to call and schedule this test/appointment own your own from our referral coordinator. This is a new process so I do not want you to miss this message.  If you are not a MyChart user, you will receive a phone call.    Please let me know how you are doing and we will stay in close touch with the results of the HIDA scan.

## 2023-03-16 NOTE — Telephone Encounter (Signed)
Test has resulted-Negative  Please note for future reference:  H. Pylori test can take up to 7 days to result.

## 2023-03-16 NOTE — Assessment & Plan Note (Addendum)
Patient nontoxic in appearance.  Left scapular pain is unchanged.  We discussed at length paint is triggered by eating.  CT abdomen and pelvis unrevealing as a relates to gallbladder disease.  Differential gastroparesis.  Pending H. pylori breath test.  Advised to continue Protonix 40 mg daily, Pepcid 20 nightly.  Pending HIDA scan.

## 2023-03-16 NOTE — Telephone Encounter (Signed)
What is status is h pylori test?

## 2023-03-16 NOTE — Progress Notes (Signed)
Assessment & Plan:  Epigastric pain Assessment & Plan: Patient nontoxic in appearance.  Left scapular pain is unchanged.  We discussed at length paint is triggered by eating.  CT abdomen and pelvis unrevealing as a relates to gallbladder disease.  Differential gastroparesis.  Pending H. pylori breath test.  Advised to continue Protonix 40 mg daily, Pepcid 20 nightly.  Pending HIDA scan.     Orders: -     NM Hepato W/EF; Future  Bilateral ovarian cysts -     US PELVIC COMPLETE WITH TRANSVAGINAL; Future     Return precautions given.   Risks, benefits, and alternatives of the medications and treatment plan prescribed today were discussed, and patient expressed understanding.   Education regarding symptom management and diagnosis given to patient on AVS either electronically or printed.  No follow-ups on file.  [name], FNP  Subjective:    Patient ID: Alexis Little, female    DOB: March 11, 1976, 47 y.o.   MRN: [id number]  CC: Alexis Little is a 47 y.o. female who presents today for follow up.   HPI: She continues to have mid left medial scapular pain which is unchanged  Pain starts after eating. She doesn't want to eat due to pain.  When she does eat, pain will recur and it radiates to her left scapula  [place] with intense left scapular pain.    Scapular pain is relieved when lays on left side. No with pain with palpation.    Pain will resolve temporarily after burping. Pain improves if not eating.    Endorses epigastric pain.  Denies shortness of, chest pain, left arm numbness, jaw pain  Diarrhea resolved. No blood in stool.   No Alcohol use     Compliant with protonix 40mg  qam , pepcid 20mg  at bedtime, however does not think that symptoms have improved.    CT abdomen and pelvis 03/15/2023 no focal liver abnormality.  Gallbladder contracted without visible gallstones.  Pancreas is on remarkable.  Kidneys are normal without  renal calculi.  Kidneys are normal without renal calculi, bilateral ovarian cyst.  Allergies: Lexapro [escitalopram oxalate] and Sertraline Current Outpatient Medications on File Prior to Visit  Medication Sig Dispense Refill   DODEX 1000 MCG/ML injection INJECT 1 ML (1,000 MCG TOTAL) INTO THE MUSCLE EVERY 14 DAYS 10 mL 1   pantoprazole (PROTONIX) 20 MG tablet Take 1-2 tablets (20-40 mg total) by mouth every morning. Take 30 minutes to hour before breakfast 30 tablet 1   SYRINGE-NEEDLE, DISP, 3 ML (B-D 3CC LUER-LOK SYR 25GX1") 25G X 1" 3 ML MISC USE FOR B12 INJECTIONS 10 each 1   Testosterone 25 MG PLLT See admin instructions.     thyroid (ARMOUR) 30 MG tablet Take 30 mg by mouth daily before breakfast.     valACYclovir (VALTREX) 500 MG tablet Take 2 tablets (1,000 mg total) by mouth daily. For 5 days ,  then once daily thereafter 35 tablet 5   No current facility-administered medications on file prior to visit.    Review of Systems  Constitutional:  Negative for chills and fever.  Respiratory:  Negative for cough.   Cardiovascular:  Negative for chest pain and palpitations.  Gastrointestinal:  Positive for abdominal pain. Negative for abdominal distention, constipation, diarrhea, nausea and vomiting.  Musculoskeletal:  Negative for neck pain.      Objective:    BP 128/86   Pulse 76   Temp 97.7 F (36.5 C) (  Oral)   Ht 5' 7.5" (1.715 m)   Wt 185 lb 9.6 oz (84.2 kg)   LMP 03/15/2023 (Exact Date)   BMI 28.64 kg/m  BP Readings from Last 3 Encounters:  03/16/23 128/86  03/11/23 128/80  05/31/22 118/74   Wt Readings from Last 3 Encounters:  03/16/23 185 lb 9.6 oz (84.2 kg)  03/11/23 183 lb (83 kg)  05/31/22 174 lb 9.6 oz (79.2 kg)    Physical Exam Vitals reviewed.  Constitutional:      Appearance: Normal appearance. She is well-developed.  Eyes:     Conjunctiva/sclera: Conjunctivae normal.  Neck:     Comments: No reproducible left scapular pain.  No rash, bony  step-off,or edema.  Cardiovascular:     Rate and Rhythm: Normal rate and regular rhythm.     Pulses: Normal pulses.     Heart sounds: Normal heart sounds.  Pulmonary:     Effort: Pulmonary effort is normal.     Breath sounds: Normal breath sounds. No wheezing, rhonchi or rales.  Abdominal:     General: Bowel sounds are normal. There is no distension.     Palpations: Abdomen is soft. Abdomen is not rigid. There is no fluid wave or mass.     Tenderness: There is no abdominal tenderness. There is no guarding or rebound.  Musculoskeletal:       Arms:     Cervical back: Full passive range of motion without pain. No torticollis. No pain with movement, spinous process tenderness or muscular tenderness. Normal range of motion.     Comments: Area of focal pain noted on diagram after eating.  Unable to reproduce pain  Skin:    General: Skin is warm and dry.  Neurological:     Mental Status: She is alert.  Psychiatric:        Speech: Speech normal.        Behavior: Behavior normal.        Thought Content: Thought content normal.

## 2023-03-17 ENCOUNTER — Telehealth: Payer: Self-pay | Admitting: Family

## 2023-03-17 DIAGNOSIS — R1013 Epigastric pain: Secondary | ICD-10-CM

## 2023-03-17 NOTE — Telephone Encounter (Signed)
Also Referral urgent to general surgery placed today

## 2023-03-17 NOTE — Telephone Encounter (Signed)
Alexis Little, thank you so much for scheduling the HIDA scan 03/31/23  Patient is in quite a bit of pain.  Is there a cancellation list?  Is this scan done at [facility]? Or [place]? Or [place]?   If you could provide phone number, I can asked the CMA to call to see if any sooner appts.

## 2023-03-18 ENCOUNTER — Telehealth: Payer: Self-pay | Admitting: Family

## 2023-03-18 ENCOUNTER — Other Ambulatory Visit: Payer: Self-pay | Admitting: Family

## 2023-03-18 DIAGNOSIS — R1013 Epigastric pain: Secondary | ICD-10-CM

## 2023-03-18 NOTE — Telephone Encounter (Signed)
Rasheedah, I have placed an urgent referral to general surgery.  Any updates here?

## 2023-03-22 ENCOUNTER — Ambulatory Visit
Admission: RE | Admit: 2023-03-22 | Discharge: 2023-03-22 | Disposition: A | Discharge status: Home / Self Care | Payer: No Typology Code available for payment source | Source: Ambulatory Visit | Attending: Family | Admitting: Family

## 2023-03-22 ENCOUNTER — Encounter
Admission: RE | Admit: 2023-03-22 | Discharge: 2023-03-22 | Disposition: A | Discharge status: Home / Self Care | Payer: No Typology Code available for payment source | Source: Ambulatory Visit | Attending: Family | Admitting: Family

## 2023-03-22 DIAGNOSIS — R1013 Epigastric pain: Secondary | ICD-10-CM | POA: Insufficient documentation

## 2023-03-22 DIAGNOSIS — N83201 Unspecified ovarian cyst, right side: Secondary | ICD-10-CM | POA: Insufficient documentation

## 2023-03-22 DIAGNOSIS — N83202 Unspecified ovarian cyst, left side: Secondary | ICD-10-CM | POA: Diagnosis present

## 2023-03-22 MED ORDER — TECHNETIUM TC 99M MEBROFENIN IV KIT
5.0000 | PACK | Freq: Once | INTRAVENOUS | Status: AC | PRN
  Administered 2023-03-22: 5.38 via INTRAVENOUS

## 2023-03-23 ENCOUNTER — Telehealth: Payer: Self-pay | Admitting: Family

## 2023-03-23 NOTE — Telephone Encounter (Signed)
Spoke to pt  Reviewed HIDA scan with patent cystic and common bile ducts.  Normal gallbladder ejection fraction.  She was able to eat yesterday without pain. She was eating normally and she didn't have shoulder pain.   She continues to burp excessively. No fever,abdominal pain, nausea, diarrhea  She is compliant with protonix 40mg  BID, pepcid 20mg  at bedtime.   She did miss one dose of protonix yesterday and burping increased.   We discussed differentials including  GERD, gastroparesis, SIBO, IBS, lactose , fructose allergy   She would like to see Dr. Maurine Minister tomorrow and we will discuss consult with GI Advised over the next couple weeks, to trial weaning from Protonix 40 mg twice daily and to return to Protonix 20 mg every morning.  She will work on this gradually.

## 2023-03-24 ENCOUNTER — Ambulatory Visit: Payer: No Typology Code available for payment source | Admitting: Internal Medicine

## 2023-03-24 ENCOUNTER — Encounter: Payer: Self-pay | Admitting: General Surgery

## 2023-03-24 ENCOUNTER — Ambulatory Visit (INDEPENDENT_AMBULATORY_CARE_PROVIDER_SITE_OTHER): Payer: No Typology Code available for payment source | Admitting: General Surgery

## 2023-03-24 VITALS — BP 111/69 | HR 61 | Temp 97.6°F | Ht 67.0 in | Wt 184.8 lb

## 2023-03-24 DIAGNOSIS — R1013 Epigastric pain: Secondary | ICD-10-CM

## 2023-03-24 DIAGNOSIS — R142 Eructation: Secondary | ICD-10-CM | POA: Diagnosis not present

## 2023-03-24 DIAGNOSIS — R140 Abdominal distension (gaseous): Secondary | ICD-10-CM

## 2023-03-24 NOTE — Progress Notes (Signed)
Patient ID: Alexis Little, female   DOB: August 16, 1975, 47 y.o.   MRN: [id number] CC: Abdominal Bloating History of Present Illness Alexis Little is a 47 y.o. female who presents in consultation for abdominal bloating.  The patient reports that over the last several weeks she has had worsening abdominal bloating and belching after meals.  She says that initially this was also accompanied by some diarrhea.  She reports that every time she eats she will feel bloating and gaseous pain.  This is also accompanied by pain between her shoulder blades.  She says that the pain is centered in her epigastrium and in the middle of her abdomen.  She has not had any episodes of vomiting.  The pain typically worsens throughout the day and will not subside in between meals.  Past Medical History Past Medical History:  Diagnosis Date   History of kidney stones    Hyperlipidemia    Squamous cell carcinoma, arm    Followed by Dr. Adolphus Birchwood       Past Surgical History:  Procedure Laterality Date   KIDNEY STONE SURGERY     KIDNEY STONE SURGERY     surgical removal, Dr. Valorie Roosevelt age 35   VAGINAL DELIVERY     2    Allergies  Allergen Reactions   Lexapro [Escitalopram Oxalate]     Racing thoughts   Sertraline     Unable to focus, panic attacks    Current Outpatient Medications  Medication Sig Dispense Refill   DODEX 1000 MCG/ML injection INJECT 1 ML (1,000 MCG TOTAL) INTO THE MUSCLE EVERY 14 DAYS 10 mL 1   pantoprazole (PROTONIX) 20 MG tablet TAKE 1-2 TABLETS (20-40 MG TOTAL) BY MOUTH EVERY MORNING. TAKE 30 MINUTES TO HOUR BEFORE BREAKFAST 180 tablet 1   SYRINGE-NEEDLE, DISP, 3 ML (B-D 3CC LUER-LOK SYR 25GX1") 25G X 1" 3 ML MISC USE FOR B12 INJECTIONS 10 each 1   Testosterone 25 MG PLLT See admin instructions.     thyroid (ARMOUR) 30 MG tablet Take 30 mg by mouth daily before breakfast.     valACYclovir (VALTREX) 500 MG tablet Take 2 tablets (1,000 mg total) by mouth daily. For 5 days ,  then once daily  thereafter 35 tablet 5   No current facility-administered medications for this visit.    Family History Family History  Problem Relation Age of Onset   Heart disease Father        AFIB   Esophageal cancer Father 81       widely metastatic; smoker; deceased 20   Lung cancer Paternal Grandfather 9       smoker; deceased 42   Colon cancer Paternal Uncle 33       currently 67   Prostate cancer Paternal Uncle 31       currently 4   Throat cancer Paternal Uncle        non-smoker; currently 4   Cancer Cousin 18       in jaw; daughter of pat uncle with throad ca   Pancreatic cancer Other 70       pat grandfather's sister; non-smoker; deceased 53s   Cancer Other        lung and larynx ca in 2 other sisters of pat grandfather       Social History Social History   Tobacco Use   Smoking status: Never   Smokeless tobacco: Never  Substance Use Topics   Alcohol use: No    Alcohol/week: 0.0 standard drinks  of alcohol   Drug use: No        ROS Full ROS of systems performed and is otherwise negative there than what is stated in the HPI  Physical Exam Blood pressure 111/69, pulse 61, temperature 97.6 F (36.4 C), temperature source Oral, height 5\' 7"  (1.702 m), weight 184 lb 12.8 oz (83.8 kg), last menstrual period 03/15/2023, SpO2 99%.  No acute distress, PERRLA, moving all extremity spontaneously, normal work of breathing. Abdomen is soft, nondistended.  There is minimal pain to deep palpation in the epigastrium.  Negative Murphy sign Data Reviewed Reviewed her recent labs that she has a normal white blood cell count. Her T bilirubin is within normal limits and her BMP is largely unremarkable.  There is a recent HIDA scan that shows a normal ejection fraction.  On CT she has no evidence of stones within her gallbladder although does appear to be somewhat contracted.  I have personally reviewed the patient's imaging and medical records.    Assessment    The patient is a  47 year old female who has had several week history of abdominal bloating and belching after meals.  She denies any pain in the right upper quadrant but does have pain in between her shoulder blades posteriorly.  Plan  Overall her studies have been negative for any etiology contributable to the gallbladder.  I discussed with her that her pain is not classic biliary colic does she have any signs and symptoms of acute cholecystitis.  She has never had an endoscopy or evaluation by GI.  I think it is a good idea to have them see her given this is more of a bloating problem.  She also has a history of thyroid disease and is on hormonal replacement for this.  I discussed with her that it could be secondary to her thyroid.  She is to follow-up with her physicians who manages this to get her TSH levels checked.  I also talked with her that if there is no other etiology that may be causing her symptoms we could discussed the risks and benefits of a cholecystectomy but I would like to workup other etiologies before this.  We will have her see GI and she can return to clinic once this workup has been completed.    Alexis Little 03/24/2023, 2:30 PM

## 2023-03-24 NOTE — Patient Instructions (Addendum)
A referral has been placed with  GI. They will call you with an appointment.   If you have any concerns or questions, please feel free to call our office. Follow up in 3 months.   Abdominal Bloating When you have abdominal bloating, your abdomen may feel full, tight, or painful. It may also look bigger than normal or swollen (distended). Common causes of abdominal bloating include: Swallowing air. Constipation. Problems digesting food. Eating too much. Irritable bowel syndrome. This is a condition that affects the large intestine. Lactose intolerance. This is an inability to digest lactose, a natural sugar in dairy products. Celiac disease. This is a condition that affects the ability to digest gluten, a protein found in some grains. Gastroparesis. This is a condition that slows down the movement of food in the stomach and small intestine. It is more common in people with diabetes mellitus. Gastroesophageal reflux disease (GERD). This is a condition that makes stomach acid flow back into the esophagus. Urinary retention. This means that the body is holding onto urine, and the bladder cannot be emptied all the way. Follow these instructions at home: Eating and drinking Avoid eating too much. Try not to swallow air while talking or eating. Avoid eating while lying down. Avoid these foods and drinks: Foods that cause gas, such as broccoli, cabbage, cauliflower, and baked beans. Carbonated drinks. Hard candy. Chewing gum. Medicines Take over-the-counter and prescription medicines only as told by your health care provider. Take probiotic medicines. These medicines contain live bacteria or yeasts that can help digestion. Take coated peppermint oil capsules. General instructions Try to exercise regularly. Exercise may help to relieve bloating that is caused by gas and relieve constipation. Keep all follow-up visits. This is important. Contact a health care provider if: You have  nausea and vomiting. You have diarrhea. You have abdominal pain. You have unusual weight loss or weight gain. You have severe pain, and medicines do not help. Get help right away if: You have chest pain. You have trouble breathing. You have shortness of breath. You have trouble urinating. You have darker urine than normal. You have blood in your stools or have dark, tarry stools. These symptoms may represent a serious problem that is an emergency. Do not wait to see if the symptoms will go away. Get medical help right away. Call your local emergency services (911 in the [place] Do not drive yourself to the hospital. Summary Abdominal bloating means that the abdomen is swollen. Common causes of abdominal bloating are swallowing air, constipation, and problems digesting food. Avoid eating too much and avoid swallowing air. Avoid foods that cause gas, carbonated drinks, hard candy, and chewing gum. This information is not intended to replace advice given to you by your health care provider. Make sure you discuss any questions you have with your health care provider. Document Revised: 12/11/2019 Document Reviewed: 12/11/2019 Elsevier Patient Education  2024 [employer].

## 2023-03-28 ENCOUNTER — Encounter: Payer: Self-pay | Admitting: Family

## 2023-03-31 ENCOUNTER — Ambulatory Visit: Payer: No Typology Code available for payment source

## 2023-04-01 ENCOUNTER — Encounter: Payer: Self-pay | Admitting: Family

## 2023-04-07 ENCOUNTER — Telehealth: Payer: Self-pay | Admitting: Internal Medicine

## 2023-04-07 ENCOUNTER — Other Ambulatory Visit: Payer: Self-pay | Admitting: Internal Medicine

## 2023-04-07 DIAGNOSIS — R928 Other abnormal and inconclusive findings on diagnostic imaging of breast: Secondary | ICD-10-CM

## 2023-04-07 DIAGNOSIS — Z1231 Encounter for screening mammogram for malignant neoplasm of breast: Secondary | ICD-10-CM

## 2023-04-07 NOTE — Telephone Encounter (Signed)
Alexis Little from [facility] called stating they need orders for a bilateral diagnostic mammogram and ultrasound for a follow-up abnormal mammogram appointment Fax- [phone]

## 2023-04-08 ENCOUNTER — Telehealth: Payer: Self-pay

## 2023-04-08 NOTE — Telephone Encounter (Signed)
Patient states she is following-up with [place] to find out the results of the internal ultrasound which was done at [facility].  Patient states the test were performed 03/22/2023.  Patient states her surgeon told her that she needs a referral to a gastro specialist, and he was supposed to place it but she has not heard from anyone.  Patient states she found out that she has cysts on her ovaries and is concerned.  I spoke with Jenate [place], CMA, and transferred call to her.

## 2023-04-08 NOTE — Telephone Encounter (Signed)
Spoke to pt and informed her that it has been taking longer to get results back due to [place] shortage of techs, but I will call to se if I can get someone to read the results. I gave pt number to call Dr Servando Snare office as she was concerned that she has not heard from them yet. Called [facility]  and left message for someone to read results of [place]

## 2023-04-08 NOTE — Telephone Encounter (Signed)
[place] orders and Mammogram orders signed and faxed to [facility] at [phone] with a completed transmission log

## 2023-04-08 NOTE — Telephone Encounter (Signed)
Jolynne called from [facility] to state patient is coming for a mammogram on Monday morning at 7:40am and they have not received a signed order yet.  Jolynne states they cannot do the mammogram without a signed order.  Jolynne states the orders are in Epic, they just need to be signed.

## 2023-04-08 NOTE — Telephone Encounter (Signed)
Orders were signed and faxed to [facility] see other phone note on 04-08-23

## 2023-04-11 ENCOUNTER — Other Ambulatory Visit: Payer: Self-pay | Admitting: Family

## 2023-04-11 ENCOUNTER — Encounter: Payer: Self-pay | Admitting: Family

## 2023-04-11 ENCOUNTER — Other Ambulatory Visit (INDEPENDENT_AMBULATORY_CARE_PROVIDER_SITE_OTHER): Payer: No Typology Code available for payment source

## 2023-04-11 DIAGNOSIS — N83209 Unspecified ovarian cyst, unspecified side: Secondary | ICD-10-CM

## 2023-04-11 NOTE — Telephone Encounter (Signed)
Sent pt my chart message her [place] results should be in her chart today per [facility]

## 2023-04-12 LAB — CA 125: CA 125: 11 U/mL (ref <35)

## 2023-04-14 ENCOUNTER — Telehealth: Payer: Self-pay

## 2023-04-14 NOTE — Telephone Encounter (Signed)
Rasheedah called from the PCP office to check on the patient referral. I inform her that I am currently call people now to schedule.

## 2023-06-07 NOTE — Progress Notes (Signed)
 Brigitte Canard, PA-C [address]  [address]  [place], [place] [address]  Main: [phone]  Fax: [phone]   Gastroenterology Consultation  Referring Provider:     Thersia Flax, MD Primary Care Physician:  Thersia Flax, MD Primary Gastroenterologist:  Brigitte Canard, PA-C / Dr. Luke Salaam   Reason for Consultation:     Epigastric Pain        HPI:   Alexis Little is a 48 y.o. y/o female referred for consultation & management  by Thersia Flax, MD.   Patient states she has had intermittent GI symptoms all of her life since childhood.    Currently she is having intermittent episodes of sharp epigastric pain with belching and increased gas.  She had flareup of symptoms for the past 4 months since October 2024.  She avoids milk and dairy.  She took Protonix  40 mg daily which helped.  She was weaned off PPI after a month or 2.  She denies chest pain.  She had associated left shoulder pain.  Epigastric pain can be worse after eating or worse on empty stomach.  No pain today.  Denies nausea, vomiting, dysphagia, or hematemesis.  She rarely drinks alcohol.  She also has chronic constipation.  Currently taking MiraLAX and magnesium daily with benefit.  Constipation is controlled on this treatment.  Family history significant for father who had esophageal cancer and uncle who had colon cancer.    She remembers having a colonoscopy and EGD over 25 years ago in her early 33s.  Results are not available.  05/2022 Cologuard test negative.  02/2023: H. pylori breath test negative.  Negative celiac panel.  Normal CBC (hemoglobin 14.8).  CMP and lipase normal.  02/2023 CT abdomen pelvis with contrast: No acute findings.  Bilateral ovarian cysts.  02/2023 HIDA scan: Normal.  GB EF 57%.  03/2023 pelvic ultrasound: Uterine fibroids, bilateral ovarian cysts.  Past Medical History:  Diagnosis Date   Heart palpitations 01/01/2021   History of kidney stones    Hyperlipidemia     Pedal edema 01/01/2021   SOB (shortness of breath) on exertion 01/01/2021   Squamous cell carcinoma, arm    Followed by Dr. Tresa Frohlich    Past Surgical History:  Procedure Laterality Date   KIDNEY STONE SURGERY     KIDNEY STONE SURGERY     surgical removal, Dr. Philbert Brave age 64   VAGINAL DELIVERY     2    Prior to Admission medications   Medication Sig Start Date End Date Taking? Authorizing Provider  DODEX  1000 MCG/ML injection INJECT 1 ML (1,000 MCG TOTAL) INTO THE MUSCLE EVERY 14 DAYS 10/07/21   Calista Catching, FNP  pantoprazole  (PROTONIX ) 20 MG tablet TAKE 1-2 TABLETS (20-40 MG TOTAL) BY MOUTH EVERY MORNING. TAKE 30 MINUTES TO HOUR BEFORE BREAKFAST 03/18/23   Arnett, Hanley Lew, FNP  SYRINGE-NEEDLE, DISP, 3 ML (B-D 3CC LUER-LOK SYR 25GX1") 25G X 1" 3 ML MISC USE FOR B12 INJECTIONS 07/14/21   Thersia Flax, MD  Testosterone  25 MG PLLT See admin instructions. 05/09/19   [provider]  thyroid  (ARMOUR) 30 MG tablet Take 30 mg by mouth daily before breakfast.    [provider]  valACYclovir  (VALTREX ) 500 MG tablet Take 2 tablets (1,000 mg total) by mouth daily. For 5 days ,  then once daily thereafter 06/03/22   Tullo, Teresa L, MD    Family History  Problem Relation Age of Onset   Heart  disease Father        AFIB   Esophageal cancer Father 53       widely metastatic; smoker; deceased 8   Lung cancer Paternal Grandfather 74       smoker; deceased 64   Colon cancer Paternal Uncle 32       currently 33   Prostate cancer Paternal Uncle 6       currently 50   Throat cancer Paternal Uncle        non-smoker; currently 36   Cancer Cousin 18       in jaw; daughter of pat uncle with throad ca   Pancreatic cancer Other 72       pat grandfather's sister; non-smoker; deceased 39s   Cancer Other        lung and larynx ca in 2 other sisters of pat grandfather     Social History   Tobacco Use   Smoking status: Never   Smokeless tobacco: Never  Substance Use  Topics   Alcohol use: No    Alcohol/week: 0.0 standard drinks of alcohol   Drug use: No    Allergies as of 06/08/2023 - Review Complete 06/08/2023  Allergen Reaction Noted   Lexapro [escitalopram oxalate]  02/25/2014   Sertraline  02/25/2014    Review of Systems:    All systems reviewed and negative except where noted in HPI.   Physical Exam:  BP 108/67   Pulse 69   Temp 97.7 F (36.5 C)   Ht 5\' 7"  (1.702 m)   Wt 189 lb 9.6 oz (86 kg)   BMI 29.70 kg/m  No LMP recorded.  General:   Alert,  Well-developed, well-nourished, pleasant and cooperative in NAD Lungs:  Respirations even and unlabored.  Clear throughout to auscultation.   No wheezes, crackles, or rhonchi. No acute distress. Heart:  Regular rate and rhythm; no murmurs, clicks, rubs, or gallops. Abdomen:  Normal bowel sounds.  No bruits.  Soft, and non-distended without masses, hepatosplenomegaly or hernias noted.  Mild Epigastric Tenderness.  Rest of abdomen is not tender.  No lower abdominal tenderness.  No guarding or rebound tenderness.    Neurologic:  Alert and oriented x3;  grossly normal neurologically. Psych:  Alert and cooperative. Normal mood and affect.  Imaging Studies: No results found.  Assessment and Plan:   [name] is a 48 y.o. y/o female has been referred for   Epigastric Pain   Scheduling EGD I discussed risks of EGD with patient to include risk of bleeding, perforation, and risk of sedation.  Patient expressed understanding and agrees to proceed with EGD.   Belching / GERD  Recommend Lifestyle Modifications to prevent Acid Reflux.  Rec. Avoid coffee, sodas, peppermint, garlic, onions, alcohol, citrus fruits, chocolate, tomatoes, fatty and spicey foods.  Avoid eating 2-3 hours before bedtime.    Recommend OTC TUMS prn, OTC Pepcid 20mg  BID prn, or OTC Prilosec 20mg  daily prn.  Constipation  Continue Miralax and Magnesium.  Continue High fiber diet and 64 ounces fluids daily.  Colon  Cancer Screening Scheduling Colonoscopy I discussed risks of colonoscopy with patient to include risk of bleeding, colon perforation, and risk of sedation.  Patient expressed understanding and agrees to proceed with colonoscopy.   Follow up based on EGD, colonoscopy results and GI symptoms.  Brigitte Canard, PA-C

## 2023-06-08 ENCOUNTER — Ambulatory Visit: Payer: No Typology Code available for payment source | Admitting: Physician Assistant

## 2023-06-08 ENCOUNTER — Encounter: Payer: Self-pay | Admitting: Physician Assistant

## 2023-06-08 VITALS — BP 108/67 | HR 69 | Temp 97.7°F | Ht 67.0 in | Wt 189.6 lb

## 2023-06-08 DIAGNOSIS — R142 Eructation: Secondary | ICD-10-CM | POA: Diagnosis not present

## 2023-06-08 DIAGNOSIS — K219 Gastro-esophageal reflux disease without esophagitis: Secondary | ICD-10-CM

## 2023-06-08 DIAGNOSIS — K5900 Constipation, unspecified: Secondary | ICD-10-CM

## 2023-06-08 DIAGNOSIS — Z1211 Encounter for screening for malignant neoplasm of colon: Secondary | ICD-10-CM

## 2023-06-08 DIAGNOSIS — R1013 Epigastric pain: Secondary | ICD-10-CM

## 2023-06-08 MED ORDER — NA SULFATE-K SULFATE-MG SULF 17.5-3.13-1.6 GM/177ML PO SOLN: 1.0000 | Freq: Once | ORAL | 0 refills | Status: AC

## 2023-06-10 LAB — ALPHA-GAL PANEL
Allergen Lamb IgE: <0.1 kU/L
Beef IgE: <0.1 kU/L
IgE (Immunoglobulin E), Serum: 37 [IU]/mL (ref 6–495)
O215-IgE Alpha-Gal: <0.1 kU/L
Pork IgE: <0.1 kU/L

## 2023-06-10 LAB — FOOD ALLERGY PROFILE
Allergen Corn, IgE: <0.1 kU/L
Clam IgE: <0.1 kU/L
Codfish IgE: <0.1 kU/L
Egg White IgE: <0.1 kU/L
Milk IgE: <0.1 kU/L
Peanut IgE: <0.1 kU/L
Scallop IgE: <0.1 kU/L
Sesame Seed IgE: <0.1 kU/L
Shrimp IgE: <0.1 kU/L
Soybean IgE: <0.1 kU/L
Walnut IgE: <0.1 kU/L
Wheat IgE: <0.1 kU/L

## 2023-06-16 ENCOUNTER — Ambulatory Visit: Payer: No Typology Code available for payment source | Admitting: General Surgery

## 2023-07-05 ENCOUNTER — Encounter
Admission: RE | Disposition: A | Discharge status: Home / Self Care | Payer: Self-pay | Source: Home / Self Care | Attending: Gastroenterology

## 2023-07-05 ENCOUNTER — Encounter: Payer: Self-pay | Admitting: Gastroenterology

## 2023-07-05 ENCOUNTER — Ambulatory Visit: Payer: No Typology Code available for payment source | Admitting: Certified Registered Nurse Anesthetist

## 2023-07-05 ENCOUNTER — Ambulatory Visit
Admission: RE | Admit: 2023-07-05 | Discharge: 2023-07-05 | Disposition: A | Discharge status: Home / Self Care | Payer: No Typology Code available for payment source | Attending: Gastroenterology | Admitting: Gastroenterology

## 2023-07-05 DIAGNOSIS — Z1211 Encounter for screening for malignant neoplasm of colon: Secondary | ICD-10-CM

## 2023-07-05 DIAGNOSIS — K449 Diaphragmatic hernia without obstruction or gangrene: Secondary | ICD-10-CM | POA: Diagnosis not present

## 2023-07-05 DIAGNOSIS — R109 Unspecified abdominal pain: Secondary | ICD-10-CM | POA: Diagnosis present

## 2023-07-05 DIAGNOSIS — K3189 Other diseases of stomach and duodenum: Secondary | ICD-10-CM

## 2023-07-05 DIAGNOSIS — K317 Polyp of stomach and duodenum: Secondary | ICD-10-CM | POA: Diagnosis not present

## 2023-07-05 DIAGNOSIS — R1013 Epigastric pain: Secondary | ICD-10-CM | POA: Diagnosis present

## 2023-07-05 HISTORY — PX: BIOPSY: SHX5522

## 2023-07-05 HISTORY — PX: POLYPECTOMY: SHX5525

## 2023-07-05 HISTORY — PX: COLONOSCOPY WITH PROPOFOL: SHX5780

## 2023-07-05 HISTORY — PX: ESOPHAGOGASTRODUODENOSCOPY (EGD) WITH PROPOFOL: SHX5813

## 2023-07-05 LAB — POCT PREGNANCY, URINE: Preg Test, Ur: NEGATIVE

## 2023-07-05 SURGERY — ESOPHAGOGASTRODUODENOSCOPY (EGD) WITH PROPOFOL
Anesthesia: General

## 2023-07-05 MED ORDER — PROPOFOL 500 MG/50ML IV EMUL
INTRAVENOUS | Status: DC | PRN
  Administered 2023-07-05: 160 ug/kg/min via INTRAVENOUS

## 2023-07-05 MED ORDER — PROPOFOL 10 MG/ML IV BOLUS
INTRAVENOUS | Status: DC | PRN
Start: 2023-07-05 — End: 2023-07-05
  Administered 2023-07-05: 60 mg via INTRAVENOUS
  Administered 2023-07-05: 10 mg via INTRAVENOUS
  Administered 2023-07-05: 20 mg via INTRAVENOUS
  Administered 2023-07-05: 10 mg via INTRAVENOUS

## 2023-07-05 MED ORDER — LIDOCAINE HCL (CARDIAC) PF 100 MG/5ML IV SOSY
PREFILLED_SYRINGE | INTRAVENOUS | Status: DC | PRN
  Administered 2023-07-05: 100 mg via INTRAVENOUS

## 2023-07-05 MED ORDER — SODIUM CHLORIDE 0.9 % IV SOLN: INTRAVENOUS | Status: DC

## 2023-07-05 MED ORDER — GLYCOPYRROLATE 0.2 MG/ML IJ SOLN
INTRAMUSCULAR | Status: DC | PRN
  Administered 2023-07-05: .2 mg via INTRAVENOUS

## 2023-07-05 MED ORDER — DEXMEDETOMIDINE HCL IN NACL 80 MCG/20ML IV SOLN
INTRAVENOUS | Status: DC | PRN
  Administered 2023-07-05 (×2): 4 ug via INTRAVENOUS
  Administered 2023-07-05: 8 ug via INTRAVENOUS
  Administered 2023-07-05: 4 ug via INTRAVENOUS

## 2023-07-05 MED ORDER — GLYCOPYRROLATE 0.2 MG/ML IJ SOLN
INTRAMUSCULAR | Status: AC
  Filled 2023-07-05: qty 1

## 2023-07-05 MED ORDER — PROPOFOL 10 MG/ML IV BOLUS
INTRAVENOUS | Status: AC
  Filled 2023-07-05: qty 20

## 2023-07-05 NOTE — Anesthesia Postprocedure Evaluation (Signed)
Anesthesia Post Note  Patient: Alexis Little  Procedure(s) Performed: ESOPHAGOGASTRODUODENOSCOPY (EGD) WITH PROPOFOL COLONOSCOPY WITH PROPOFOL  Patient location during evaluation: Endoscopy Anesthesia Type: General Level of consciousness: awake and alert Pain management: pain level controlled Vital Signs Assessment: post-procedure vital signs reviewed and stable Respiratory status: spontaneous breathing, nonlabored ventilation, respiratory function stable and patient connected to nasal cannula oxygen Cardiovascular status: blood pressure returned to baseline and stable Postop Assessment: no apparent nausea or vomiting Anesthetic complications: no   No notable events documented.   Last Vitals:  Vitals:   07/05/23 1035 07/05/23 1045  BP: 92/66 102/75  Pulse: (!) 58 66  Resp:    Temp:    SpO2: 97% 99%    Last Pain:  Vitals:   07/05/23 1010  TempSrc: Temporal                 Cleda Mccreedy Baylea Milburn

## 2023-07-05 NOTE — Transfer of Care (Signed)
Immediate Anesthesia Transfer of Care Note  Patient: Alexis Little  Procedure(s) Performed: ESOPHAGOGASTRODUODENOSCOPY (EGD) WITH PROPOFOL COLONOSCOPY WITH PROPOFOL  Patient Location: PACU  Anesthesia Type:General  Level of Consciousness: drowsy  Airway & Oxygen Therapy: Patient Spontanous Breathing  Post-op Assessment: Report given to RN and Post -op Vital signs reviewed and stable  Post vital signs: Reviewed and stable  Last Vitals:  Vitals Value Taken Time  BP 130/114 07/05/23 1015  Temp    Pulse 66 07/05/23 1016  Resp    SpO2 95 % 07/05/23 1016  Vitals shown include unfiled device data.  Last Pain:  Vitals:   07/05/23 1010  TempSrc: Temporal         Complications: No notable events documented.

## 2023-07-05 NOTE — Anesthesia Procedure Notes (Signed)
Date/Time: 07/05/2023 9:50 AM  Performed by: Malva Cogan, CRNAPre-anesthesia Checklist: Patient identified, Emergency Drugs available, Suction available, Patient being monitored and Timeout performed Patient Re-evaluated:Patient Re-evaluated prior to induction Oxygen Delivery Method: Nasal cannula Induction Type: IV induction Airway Equipment and Method: Bite block Placement Confirmation: CO2 detector and positive ETCO2

## 2023-07-05 NOTE — H&P (Signed)
Wyline Mood, MD [address], [address], [place], [place], [address] [address], [address], [place], [place], [address] Phone: [phone]  Fax: [phone]  Primary Care Physician:  Sherlene Shams, MD   Pre-Procedure History & Physical: HPI:  Alexis Little is a 48 y.o. female is here for an endoscopy and colonoscopy    Past Medical History:  Diagnosis Date   Heart palpitations 01/01/2021   History of kidney stones    Hyperlipidemia    Pedal edema 01/01/2021   SOB (shortness of breath) on exertion 01/01/2021   Squamous cell carcinoma, arm    Followed by Dr. Adolphus Birchwood    Past Surgical History:  Procedure Laterality Date   KIDNEY STONE SURGERY     KIDNEY STONE SURGERY     surgical removal, Dr. Valorie Roosevelt age 80   SPHINCTEROTOMY     VAGINAL DELIVERY     2    Prior to Admission medications   Medication Sig Start Date End Date Taking? Authorizing Provider  DODEX 1000 MCG/ML injection INJECT 1 ML (1,000 MCG TOTAL) INTO THE MUSCLE EVERY 14 DAYS 10/07/21  Yes Arnett, Lyn Records, FNP  thyroid (ARMOUR) 30 MG tablet Take 30 mg by mouth daily before breakfast.   Yes [provider]  SYRINGE-NEEDLE, DISP, 3 ML (B-D 3CC LUER-LOK SYR 25GX1") 25G X 1" 3 ML MISC USE FOR B12 INJECTIONS 07/14/21   Sherlene Shams, MD  Testosterone 25 MG PLLT See admin instructions. 05/09/19   [provider]  valACYclovir (VALTREX) 500 MG tablet Take 2 tablets (1,000 mg total) by mouth daily. For 5 days ,  then once daily thereafter 06/03/22   Sherlene Shams, MD    Allergies as of 06/08/2023 - Review Complete 06/08/2023  Allergen Reaction Noted   Lexapro [escitalopram oxalate]  02/25/2014   Sertraline  02/25/2014    Family History  Problem Relation Age of Onset   Heart disease Father        AFIB   Esophageal cancer Father 76       widely metastatic; smoker; deceased 8   Lung cancer Paternal Grandfather 66       smoker; deceased 64   Colon cancer Paternal Uncle 45        currently 23   Prostate cancer Paternal Uncle 46       currently 60   Throat cancer Paternal Uncle        non-smoker; currently 75   Cancer Cousin 18       in jaw; daughter of pat uncle with throad ca   Pancreatic cancer Other 70       pat grandfather's sister; non-smoker; deceased 39s   Cancer Other        lung and larynx ca in 2 other sisters of pat grandfather    Social History   Socioeconomic History   Marital status: Divorced    Spouse name: Not on file   Number of children: Not on file   Years of education: Not on file   Highest education level: Not on file  Occupational History   Not on file  Tobacco Use   Smoking status: Never   Smokeless tobacco: Never  Substance and Sexual Activity   Alcohol use: No    Alcohol/week: 0.0 standard drinks of alcohol   Drug use: No   Sexual activity: Not on file  Other Topics Concern   Not on file  Social History Narrative   Lives in [place] with  fiance and three children. 1 dog in home.      Work - Engineer, petroleum, Animal nutritionist.      Diet - regular diet      Exercise - started walking daily   Social Drivers of Health   Financial Resource Strain: Not on file  Food Insecurity: Not on file  Transportation Needs: Not on file  Physical Activity: Not on file  Stress: Not on file  Social Connections: Not on file  Intimate Partner Violence: Not on file    Review of Systems: See HPI, otherwise negative ROS  Physical Exam: BP 120/75   Pulse 65   Temp (!) 96.5 F (35.8 C) (Temporal)   Resp 16   Wt 86.3 kg   SpO2 99%   BMI 29.79 kg/m  General:   Alert,  pleasant and cooperative in NAD Head:  Normocephalic and atraumatic. Neck:  Supple; no masses or thyromegaly. Lungs:  Clear throughout to auscultation, normal respiratory effort.    Heart:  +S1, +S2, Regular rate and rhythm, No edema. Abdomen:  Soft, nontender and nondistended. Normal bowel sounds, without guarding, and without rebound.   Neurologic:  Alert  and  oriented x4;  grossly normal neurologically.  Impression/Plan: Alexis Little is here for an endoscopy and colonoscopy  to be performed for  evaluation of abdominal pain and colon cancer screening     Risks, benefits, limitations, and alternatives regarding endoscopy have been reviewed with the patient.  Questions have been answered.  All parties agreeable.   Wyline Mood, MD  07/05/2023, 9:38 AM abdomina

## 2023-07-05 NOTE — Anesthesia Preprocedure Evaluation (Signed)
Anesthesia Evaluation  Patient identified by MRN, date of birth, ID band Patient awake    Reviewed: Allergy & Precautions, NPO status , Patient's Chart, lab work & pertinent test results  History of Anesthesia Complications (+) PONV and history of anesthetic complications  Airway Mallampati: III  TM Distance: <3 FB Neck ROM: full    Dental  (+) Chipped   Pulmonary neg pulmonary ROS, neg shortness of breath   Pulmonary exam normal        Cardiovascular Exercise Tolerance: Good negative cardio ROS Normal cardiovascular exam     Neuro/Psych negative neurological ROS  negative psych ROS   GI/Hepatic negative GI ROS, Neg liver ROS,neg GERD  ,,  Endo/Other  negative endocrine ROS    Renal/GU negative Renal ROS  negative genitourinary   Musculoskeletal   Abdominal   Peds  Hematology negative hematology ROS (+)   Anesthesia Other Findings Past Medical History: 01/01/2021: Heart palpitations No date: History of kidney stones No date: Hyperlipidemia 01/01/2021: Pedal edema 01/01/2021: SOB (shortness of breath) on exertion No date: Squamous cell carcinoma, arm     Comment:  Followed by Dr. Adolphus Birchwood  Past Surgical History: No date: KIDNEY STONE SURGERY No date: KIDNEY STONE SURGERY     Comment:  surgical removal, Dr. Valorie Roosevelt age 48 No date: VAGINAL DELIVERY     Comment:  2     Reproductive/Obstetrics negative OB ROS                             Anesthesia Physical Anesthesia Plan  ASA: 2  Anesthesia Plan: General   Post-op Pain Management:    Induction: Intravenous  PONV Risk Score and Plan: Propofol infusion and TIVA  Airway Management Planned: Natural Airway and Nasal Cannula  Additional Equipment:   Intra-op Plan:   Post-operative Plan:   Informed Consent: I have reviewed the patients History and Physical, chart, labs and discussed the procedure including the risks,  benefits and alternatives for the proposed anesthesia with the patient or authorized representative who has indicated his/her understanding and acceptance.     Dental Advisory Given  Plan Discussed with: Anesthesiologist, CRNA and Surgeon  Anesthesia Plan Comments: (Patient consented for risks of anesthesia including but not limited to:  - adverse reactions to medications - risk of airway placement if required - damage to eyes, teeth, lips or other oral mucosa - nerve damage due to positioning  - sore throat or hoarseness - Damage to heart, brain, nerves, lungs, other parts of body or loss of life  Patient voiced understanding and assent.)       Anesthesia Quick Evaluation

## 2023-07-05 NOTE — Op Note (Signed)
[facility] ([facility]) Gastroenterology Patient Name: Alexis Little Procedure Date: 07/05/2023 9:35 AM MRN: [id number] Account #: [id number] Date of Birth: 06-09-1975 Admit Type: Outpatient Age: 48 Room: [facility] ENDO ROOM 2 Gender: Female Note Status: Finalized Instrument Name: Prentice Docker [id number] Procedure:             Colonoscopy Indications:           Screening for colorectal malignant neoplasm Providers:             Wyline Mood MD, MD Medicines:             Monitored Anesthesia Care Complications:         No immediate complications. Procedure:             Pre-Anesthesia Assessment:                        - Prior to the procedure, a History and Physical was                         performed, and patient medications, allergies and                         sensitivities were reviewed. The patient's tolerance                         of previous anesthesia was reviewed.                        - The risks and benefits of the procedure and the                         sedation options and risks were discussed with the                         patient. All questions were answered and informed                         consent was obtained.                        - After reviewing the risks and benefits, the patient                         was deemed in satisfactory condition to undergo the                         procedure.                        - ASA Grade Assessment: II - A patient with mild                         systemic disease.                        After obtaining informed consent, the colonoscope was                         passed under direct vision. Throughout the procedure,  the patient's blood pressure, pulse, and oxygen                         saturations were monitored continuously. The                         Colonoscope was introduced through the anus and                         advanced to the the cecum, identified by the                          appendiceal orifice. The colonoscopy was performed                         with ease. The patient tolerated the procedure well.                         The quality of the bowel preparation was excellent.                         The ileocecal valve, appendiceal orifice, and rectum                         were photographed. Findings:      The perianal and digital rectal examinations were normal.      The entire examined colon appeared normal on direct and retroflexion       views. Impression:            - The entire examined colon is normal on direct and                         retroflexion views.                        - No specimens collected. Recommendation:        - Discharge patient to home (with escort).                        - Resume previous diet.                        - Continue present medications.                        - Repeat colonoscopy in 10 years for screening                         purposes. Procedure Code(s):     --- Professional ---                        [phone], Colonoscopy, flexible; diagnostic, including                         collection of specimen(s) by brushing or washing, when                         performed (separate procedure) Diagnosis Code(s):     --- Professional ---  Z12.11, Encounter for screening for malignant neoplasm                         of colon CPT copyright 2022 [employer]. All rights reserved. The codes documented in this report are preliminary and upon coder review may  be revised to meet current compliance requirements. Wyline Mood, MD Wyline Mood MD, MD 07/05/2023 10:13:56 AM This report has been signed electronically. Number of Addenda: 0 Note Initiated On: 07/05/2023 9:35 AM Scope Withdrawal Time: 0 hours 7 minutes 47 seconds  Total Procedure Duration: 0 hours 10 minutes 3 seconds  Estimated Blood Loss:  Estimated blood loss: none.      [facility]

## 2023-07-05 NOTE — Op Note (Signed)
[facility] Gastroenterology Patient Name: Alexis Little Procedure Date: 07/05/2023 9:35 AM MRN: [id number] Account #: [id number] Date of Birth: 03-07-1976 Admit Type: Outpatient Age: 48 Room: [facility] ENDO ROOM 2 Gender: Female Note Status: Finalized Instrument Name: Upper Endoscope [id number] Procedure:             Upper GI endoscopy Indications:           Abdominal pain Providers:             Wyline Mood MD, MD Medicines:             Monitored Anesthesia Care Complications:         No immediate complications. Procedure:             Pre-Anesthesia Assessment:                        - Prior to the procedure, a History and Physical was                         performed, and patient medications, allergies and                         sensitivities were reviewed. The patient's tolerance                         of previous anesthesia was reviewed.                        - The risks and benefits of the procedure and the                         sedation options and risks were discussed with the                         patient. All questions were answered and informed                         consent was obtained.                        - ASA Grade Assessment: II - A patient with mild                         systemic disease.                        After obtaining informed consent, the endoscope was                         passed under direct vision. Throughout the procedure,                         the patient's blood pressure, pulse, and oxygen                         saturations were monitored continuously. The Endoscope                         was introduced through the mouth, and advanced to the  third part of duodenum. The upper GI endoscopy was                         accomplished with ease. The patient tolerated the                         procedure well. Findings:      The esophagus was normal.      The examined duodenum was normal.      A  small hiatal hernia was present.      A single 25 mm pedunculated polyp with no bleeding and no stigmata of       recent bleeding was found on the greater curvature of the stomach. The       polyp was removed with a hot snare. Resection and retrieval were       complete. To prevent bleeding after the polypectomy, one hemostatic clip       was successfully placed. Clip manufacturer: [employer]. There was       no bleeding at the end of the procedure.      Normal mucosa was found in the stomach. Biopsies were taken with a cold       forceps for histology. Impression:            - Normal esophagus.                        - Normal examined duodenum.                        - Small hiatal hernia.                        - A single gastric polyp. Resected and retrieved. Clip                         was placed. Clip manufacturer: [employer].                        - Normal mucosa was found in the stomach. Biopsied. Recommendation:        - Await pathology results.                        - Perform a colonoscopy today. Procedure Code(s):     --- Professional ---                        [phone], Esophagogastroduodenoscopy, flexible,                         transoral; with removal of tumor(s), polyp(s), or                         other lesion(s) by snare technique                        [id number], 59, Esophagogastroduodenoscopy, flexible,                         transoral; with biopsy, single or multiple Diagnosis Code(s):     --- Professional ---  K44.9, Diaphragmatic hernia without obstruction or                         gangrene                        K31.7, Polyp of stomach and duodenum                        R10.9, Unspecified abdominal pain CPT copyright 2022 American [employer] Association. All rights reserved. The codes documented in this report are preliminary and upon coder review may  be revised to meet current compliance requirements. Wyline Mood, MD Wyline Mood  MD, MD 07/05/2023 10:00:41 AM This report has been signed electronically. Number of Addenda: 0 Note Initiated On: 07/05/2023 9:35 AM Estimated Blood Loss:  Estimated blood loss: none.      [facility]

## 2023-07-06 ENCOUNTER — Encounter: Payer: Self-pay | Admitting: Gastroenterology

## 2023-07-06 LAB — SURGICAL PATHOLOGY

## 2023-07-21 ENCOUNTER — Telehealth: Payer: Self-pay

## 2023-07-21 NOTE — Telephone Encounter (Signed)
 Sent mychart message with results

## 2023-07-21 NOTE — Telephone Encounter (Signed)
-----   Message from Wyline Mood sent at 07/18/2023  9:50 AM EST ----- P0lyp benign and bx showed no abnormalities

## 2023-12-05 ENCOUNTER — Telehealth: Payer: Self-pay

## 2023-12-05 NOTE — Telephone Encounter (Signed)
 Printed out and placed in Dr. Marylynn folder to be signed. Then will fax back.

## 2023-12-05 NOTE — Telephone Encounter (Signed)
 Copied from CRM [id number]. Topic: Referral - Request for Referral >> Dec 05, 2023 11:05 AM Viola FALCON wrote: Did the patient discuss referral with their provider in the last year? Yes (If No - schedule appointment) (If Yes - send message)  Appointment offered? No  Type of order/referral and detailed reason for visit: mammogram   Preference of office, provider, location: [facility]  If referral order, have you been seen by this specialty before? Yes (If Yes, this issue or another issue? When? Where?  Can we respond through MyChart? Yes

## 2023-12-07 NOTE — Telephone Encounter (Signed)
 Order has been faxed

## 2023-12-12 ENCOUNTER — Telehealth: Payer: Self-pay | Admitting: Internal Medicine

## 2023-12-12 DIAGNOSIS — Z1231 Encounter for screening mammogram for malignant neoplasm of breast: Secondary | ICD-10-CM

## 2023-12-12 DIAGNOSIS — R928 Other abnormal and inconclusive findings on diagnostic imaging of breast: Secondary | ICD-10-CM

## 2023-12-12 NOTE — Telephone Encounter (Signed)
 Copied from CRM [id number]. Topic: Referral - Question >> Dec 12, 2023 11:40 AM DeAngela L wrote: Reason for CRM: Joen calling with [facility] mammography About the patients 3d bilateral diagnostic mammogram the office has additional questions  What is the Order  for  Is this for annual screening and 6 month follow up, Or is this a request to have the patient come back now or in Dec? Asking cause her annual screening would have be due in Nov 2025, but the radiologist recommended for her to return in Nov 2025, and the question is there a new concern for the patient and she needs to return now? or is this for the 12 month follow up? (Also the office states along with the mammography she will need a left breast ultra sound limited ordered)  Joen ask please fax a new order with details Call back num [phone] fax [phone]

## 2023-12-12 NOTE — Telephone Encounter (Signed)
 Printed out orders and placed in Dr. Marylynn to be signed folder

## 2023-12-13 NOTE — Telephone Encounter (Signed)
 I have fixed the orders and placed in quick sign folder for signature.

## 2023-12-13 NOTE — Addendum Note (Signed)
 Addended by: HARRIETTE RAISIN on: 12/13/2023 03:14 PM   Modules accepted: Orders

## 2024-04-12 ENCOUNTER — Encounter: Payer: Self-pay | Admitting: Internal Medicine

## 2024-04-12 LAB — HM MAMMOGRAPHY

## 2024-07-17 ENCOUNTER — Ambulatory Visit: Admitting: Internal Medicine
